# Patient Record
Sex: Female | Born: 1950 | Race: White | Hispanic: No
Health system: Midwestern US, Academic
[De-identification: ages and names within clinical notes are randomized; demographics above are authoritative.]

---

## 2017-01-27 MED ORDER — SINCALIDE 5 MCG IJ SOLR
1.5 ug | Freq: Once | INTRAVENOUS | 0 refills | Status: CP
Start: 2017-01-27 — End: ?

## 2017-01-27 MED ORDER — RP DX TC-99M MEBROFENIN MCI
5 | Freq: Once | INTRAVENOUS | 0 refills | Status: CP
Start: 2017-01-27 — End: ?

## 2017-02-20 MED ORDER — SERTRALINE 50 MG PO TAB
50 mg | ORAL_TABLET | Freq: Every day | ORAL | 3 refills | Status: DC
Start: 2017-02-20 — End: 2017-10-19

## 2017-07-07 ENCOUNTER — Encounter: Admit: 2017-07-07 | Discharge: 2017-07-07 | Payer: BC Managed Care – PPO

## 2017-07-10 ENCOUNTER — Encounter: Admit: 2017-07-10 | Discharge: 2017-07-10 | Payer: BC Managed Care – PPO

## 2017-07-10 DIAGNOSIS — R490 Dysphonia: Principal | ICD-10-CM

## 2017-07-25 ENCOUNTER — Encounter: Admit: 2017-07-25 | Discharge: 2017-07-25 | Payer: BC Managed Care – PPO

## 2017-07-25 ENCOUNTER — Ambulatory Visit: Admit: 2017-07-25 | Discharge: 2017-07-25 | Payer: BC Managed Care – PPO

## 2017-07-25 DIAGNOSIS — F444 Conversion disorder with motor symptom or deficit: ICD-10-CM

## 2017-07-25 DIAGNOSIS — F329 Major depressive disorder, single episode, unspecified: ICD-10-CM

## 2017-07-25 DIAGNOSIS — J383 Other diseases of vocal cords: Principal | ICD-10-CM

## 2017-07-25 DIAGNOSIS — R112 Nausea with vomiting, unspecified: ICD-10-CM

## 2017-07-25 DIAGNOSIS — Z87898 Personal history of other specified conditions: ICD-10-CM

## 2017-07-25 DIAGNOSIS — E785 Hyperlipidemia, unspecified: Principal | ICD-10-CM

## 2017-07-25 DIAGNOSIS — R490 Dysphonia: Principal | ICD-10-CM

## 2017-07-25 DIAGNOSIS — K219 Gastro-esophageal reflux disease without esophagitis: ICD-10-CM

## 2017-07-25 NOTE — Progress Notes
ATTESTATION    I have reviewed the information from this visit and agree with the impression and recommendations.    Staff name:  James David Shannon Kirkendall, MD Date:  07/25/2017

## 2017-07-25 NOTE — Progress Notes
Videostroboscopy completed today.  Please see Dr. Garnett's note or the Procedures section for a link to the full report with still images.

## 2017-08-07 ENCOUNTER — Encounter: Admit: 2017-08-07 | Discharge: 2017-08-07 | Payer: BC Managed Care – PPO

## 2017-08-07 DIAGNOSIS — Z1231 Encounter for screening mammogram for malignant neoplasm of breast: Principal | ICD-10-CM

## 2017-08-16 ENCOUNTER — Ambulatory Visit: Admit: 2017-08-16 | Discharge: 2017-08-16 | Payer: BC Managed Care – PPO

## 2017-08-16 DIAGNOSIS — F444 Conversion disorder with motor symptom or deficit: ICD-10-CM

## 2017-08-16 DIAGNOSIS — J383 Other diseases of vocal cords: ICD-10-CM

## 2017-08-16 DIAGNOSIS — R490 Dysphonia: Principal | ICD-10-CM

## 2017-08-16 DIAGNOSIS — K219 Gastro-esophageal reflux disease without esophagitis: ICD-10-CM

## 2017-08-16 NOTE — Progress Notes
[facility]  Voice Evaluation [phone])  Staff: Sherren Kerns, M.D.  Date: 08/23/2017   G Codes: U0454: CJ - mild + G9172: CI - minimal    S   Rebekah Hill was referred to the [facility] per Dr. Janeece Agee request for a speech pathology voice evaluation and subsequent treatment program.  Patient was seen by Brunetta Jeans, MA CCC-SLP on 07/25/2017.  Videostroboscopy was performed.  Please see full report in patient's chart for further details. Patient presents with a diagnosis of hypofunctional dysphonia with compensatory hyperfunction.     O  CASE HISTORY  Voice complaint: Patient reports that her voice is hoarse.  She states that her voice is raspy and cuts out and she has a difficult time projecting.  She notes that talking is very strained and it makes her very tired to talk.  She has a difficult time reading a book to her grandchildren without losing my voice.  Onset: ~5 months  Voice usage: personal and social.  Voice usage comments: Patient reports that she uses her voice on the phone a lot to family and friends.  She states that phone use is particularly challenging as she has to force it more.  She also communicates with coworkers.  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     Patient Self Rating    Amount of talking: 3.  Quality of voice on day of evaluation: 2.  Severity of problem: 3.    Medical Issues:   Head or Neck surgeries: None reported aside from radio ablation on the neck (C2-C6)  LPR/GERD s/s or meds: None reported  Seasonal or environmental allergies: Patient reports that she may have some allergies but never diagnosed  Smoking history: None reported  Postnasal drainage: Yes, currently, associated with allergies   Pulmonary history: None reported  Dysphagia: Occasionally I haven't had a problem in a while.  She notes severe pain in esophagus and would occasionally make her throw up, she reports that this has not happened in over a year Dietary Issues:   Water: 32-42 ounces/day  Caffeine: None reported  Alcohol: Rarely    Frequent cough: No.  Freq throat clear: Yes  Hearing WNL?: Yes    VOICE EVALUATION    Acoustic Parameters:    A Multi-Dimensional Voice Profile (MDVP) was completed.  Full report generated and placed in patient's chart.  Patient exhibited a mean Fo of 168.226Hz .    Aerodynamics:    Maximum sustained phonation: 10.80 seconds (<6 =abnormal, 15 or >= WNL).  S=14.95.  Z=23.52. S/Z Ratio= 0.64.  (>than 1.4 considered abnormal)    Type of breathing noted: Thoracic.    Perceptual Ratings:    G 1.  R 1.  B 1.  A 0. S 0. TOTAL = 3.    Pitch  Amount of pitch variability: WNL.  Pitch breaks: Yes. x1 moving from chest to head voice    Loudness  Typical level of loudness: Appropriate for situation.  Amount of loudness variability: Range of emphasis WNL.  Loudness range from soft to maximum level: YES (normal range).    Articulation: WNL.  Rate: WNL.  Resonance: WNL.  Tension/Effort noted in the jaw, neck, face, and perilaryngeal areas: Mild.  Pain upon palpation of the perilaryngeal area: Yes.    Voice was characterized as: husky, raspy, slightly strained.    A  IMPRESSIONS  Patient presents with: mild dysphonia.    The following therapeutic strategies were stimulable: Vocal fold  adductor exercises.  -May require FFF/resonant voice work as well as circumlaryngeal massage    Prognosis for modifying etiologic factors and improving vocal techniques is: excellent.    P  RECOMMENDATIONS  Voice Therapy - Approximately 10 sessions over the next 6 to 8 weeks working on the following goals:    Long-term goal:  Patient will exhibit optimal function for professional, personal, and social needs.    Short-term goals:     1. Patient will increase understanding of normal phonation and abnormal phonation.    2. Patient will increase understanding of and compliance with vocal hygiene guidelines. Patient will identify good vocal hygiene given functional situations with greater than 90% accuracy.    3. Patient will demonstrate diaphragmatic breathing during structured and conversational speech tasks with 90% accuracy.    4. Patient will exhibit 90% accuracy with adductor vocal fold exercises.    5. Patient will increase loudness during structured and conversational speech tasks 90% of the time.      The findings and recommendations were discussed with the patient and referring physician with good understanding and agreement.    CH: 0% Impaired  CI: 1% to 19% Impaired  CJ: 20% to 39% Impaired  CK: 40% to 59% Impaired  CL: 60% to 79% Impaired  CM: 80% to 99% Impaired  CN: 100% Impaired

## 2017-08-17 ENCOUNTER — Encounter: Admit: 2017-08-17 | Discharge: 2017-08-17 | Payer: BC Managed Care – PPO

## 2017-08-17 DIAGNOSIS — R490 Dysphonia: Principal | ICD-10-CM

## 2017-08-18 ENCOUNTER — Ambulatory Visit: Admit: 2017-08-18 | Discharge: 2017-08-18 | Payer: BC Managed Care – PPO

## 2017-08-18 DIAGNOSIS — Z1231 Encounter for screening mammogram for malignant neoplasm of breast: Principal | ICD-10-CM

## 2017-08-18 NOTE — Progress Notes
Noted, no change in tx.

## 2017-09-07 ENCOUNTER — Ambulatory Visit: Admit: 2017-09-07 | Discharge: 2017-09-07 | Payer: BC Managed Care – PPO

## 2017-09-07 DIAGNOSIS — J383 Other diseases of vocal cords: ICD-10-CM

## 2017-09-07 DIAGNOSIS — F444 Conversion disorder with motor symptom or deficit: ICD-10-CM

## 2017-09-07 DIAGNOSIS — R490 Dysphonia: Principal | ICD-10-CM

## 2017-09-07 DIAGNOSIS — K219 Gastro-esophageal reflux disease without esophagitis: ICD-10-CM

## 2017-09-07 NOTE — Progress Notes
[facility]  Voice Therapy [phone])  Staff: Alona Bene, M.D.  Date: 09/07/2017      S   Rebekah Hill was referred to the [facility] per Dr. Janeece Agee request for a speech pathology voice evaluation and subsequent treatment program.  Patient was seen by Brunetta Jeans, MA CCC-SLP on 07/25/2017.  Videostroboscopy was performed.  Please see full report in patient's chart for further details. Patient presents with a diagnosis of hypofunctional dysphonia with compensatory hyperfunction.  Rebekah Hill returns to clinic today to work on the goals established during the voice evaluation session.    She reports that her voice was doing well prior to getting sick. She reports that her voice was stronger during exercises, but noted that she was seeing limited improvement during conversational speech.     O  Long-term goal:  Patient will exhibit optimal function for professional, personal, and social needs.    Short-term goals:     1.  Patient will increase understanding of normal phonation and abnormal phonation.  INITIATED Patient identified abnormal phonation with 80% accuracy and mild cueing from clinician.     2.  Patient will increase understanding of and compliance with vocal hygiene guidelines. Patient will identify good vocal hygiene given functional situations with greater than 90% accuracy.  INITIATED    3.  Patient will demonstrate diaphragmatic breathing during structured and conversational speech tasks with 90% accuracy.  INITIATED.  Patient utilized diaphragmatic breathing with 90% accuracy, mild cueing from clinician.     4.  Patient will complete vocal fold adduction exercises with 90% accuracy independently.  INITIATED.  Completed with 80% accuracy, mild to moderate cueing from clinician to increase loudness and keep her tongue toward the front of the mouth/not retract to her throat to improve clarity and eliminate tension as well as cueing required to increase loudness. A  Rebekah Hill demonstrates mild dysphonia characterized by reduced volume with husky quality.  Improved technique today for reduced strain and better quality.  Will continue to build strength and may need to initiate resonant voice pending progress by next visit.  She still struggles with reading to her grandchildren voice-wise, and will bring in a book for evaluation during next session.    P  Recommendations include for patient to complete adduction exercises as directed.  She plans to return to clinic in 3 weeks to continue to work on the above goals.  Rebekah Hill was encouraged to contact me with any further questions or concerns and left the clinic in excellent condition.

## 2017-09-07 NOTE — Progress Notes
ATTESTATION    I have reviewed the information from this visit and agree with the impression and recommendations.    Staff name:  Lynise Porr, MD Date:  09/07/2017

## 2017-09-19 ENCOUNTER — Encounter: Admit: 2017-09-19 | Discharge: 2017-09-19 | Payer: BC Managed Care – PPO

## 2017-09-19 ENCOUNTER — Ambulatory Visit: Admit: 2017-09-19 | Discharge: 2017-09-20 | Payer: BC Managed Care – PPO

## 2017-09-19 DIAGNOSIS — F329 Major depressive disorder, single episode, unspecified: ICD-10-CM

## 2017-09-19 DIAGNOSIS — J0110 Acute frontal sinusitis, unspecified: Principal | ICD-10-CM

## 2017-09-19 DIAGNOSIS — Z87898 Personal history of other specified conditions: ICD-10-CM

## 2017-09-19 DIAGNOSIS — R112 Nausea with vomiting, unspecified: ICD-10-CM

## 2017-09-19 DIAGNOSIS — E785 Hyperlipidemia, unspecified: Principal | ICD-10-CM

## 2017-09-19 MED ORDER — AMOXICILLIN-POT CLAVULANATE 875-125 MG PO TAB
1 | ORAL_TABLET | Freq: Two times a day (BID) | ORAL | 0 refills | 7.00000 days | Status: AC
Start: 2017-09-19 — End: 2019-08-19

## 2017-09-19 NOTE — Progress Notes
Date of Service: 09/19/2017    Rebekah Hill is a 66 y.o. female.  DOB: [birth year]  MRN: [id number]     Subjective:             History of Present Illness  Chief Complaint   Patient presents with   ??? URI     x 1 week, cough, swollen glands, pressure in forehead, stuffy nose     Rebekah Hill is here today for evaluation of upper respiratory symptoms.  For a week she has been dealing with a cough, tender glands in her neck, sinus pressure and a stuffy nose. She has been taking Advil 400 mg every 4 hrs while away. Taken Mucinex only in the am. She thinks she's getting worse. She has not taken antibiotics recently. NKDA.          Constitutional: energy level low.  Weight stable. No fever.  No chills.  + Fatigue.  + Headache.  Eyes:  No vision problems, no drainage.  Ears/Nose/Mouth/Throat:  No hearing difficulties.   + drainage and congestion.  No oral ulcers.  No sore throat.  No difficulty swallowing.  CV: No chest pain or palpitations.  Resp: No shortness of breath.  + Cough.  GI: No abdominal pain.  Bowels normal, with no hematochezia or melena.  GU: no dysuria.  No urinary frequency.  No significant incontinence.  MS:  No significant joint or muscle pain.  SKIN:  Has noted no new moles or changes in moles.  NEURO:  No tingling, numbness, weakness, or other complaints.  PSYCH: Mood good, denies depression or anxiety.  Sleeps well.              Objective:         ??? amoxicillin/K clavulanate (AUGMENTIN) 875/125 mg tablet Take one tablet by mouth every 12 hours. Take with food.   ??? Biotin 1 mg tab Take  by mouth daily.   ??? DOCOSAHEXANOIC ACID/EPA (FISH OIL PO) Take  by mouth daily.   ??? ERGOCALCIFEROL (VITAMIN D2) (VITAMIN D PO) Take 2,000 Units by mouth daily.   ??? FEXOFENADINE HCL (ALLEGRA PO) Take  by mouth daily as needed.   ??? GLUCOSAMINE HCL/CHONDR SU A NA (OSTEO BI-FLEX PO) Take 1 mg by mouth daily.   ??? MULTIVITAMIN W-MINERALS/LUTEIN (CENTRUM SILVER PO) Take  by mouth daily. ??? sertraline (ZOLOFT) 50 mg tablet Take 1 tablet by mouth daily.     Vitals:    09/19/17 1602 09/19/17 1615   BP: 142/78 136/78   Pulse: 70    Temp: 36.3 ???C (97.4 ???F)    TempSrc: Oral    Weight: 57.1 kg (125 lb 12.8 oz)    Height: 154.9 cm (61)      Body mass index is 23.77 kg/m???.   Alert, no distress.  HEENT:  External ear canals are negative.  TM's are full bilaterally with serous effusions, no erythema.  Oropharynx is red and streaky, with no exudates or ulcers seen.  Dentition appears normal.  Frontal sinuses are tender to palpation bilat.  PERRLA.  EOMI.  Conjunctiva are non-icteric and are not injected.  Neck -mild SM and AC LAD.  LUNGS:  Clear to auscultation bilat, with no rales, rhonchi, or wheezing.           Assessment and Plan:  Rebekah Hill was seen today for uri.    Diagnoses and all orders for this visit:    Acute non-recurrent frontal sinusitis    Other orders  -  amoxicillin/K clavulanate (AUGMENTIN) 875/125 mg tablet; Take one tablet by mouth every 12 hours. Take with food.  -     FLU VACCINE =>6 MONTHS QUADRIVALENT PF      Frontal sinusitis-Augmentin as prescribed.  She requested a printed prescription to take to pharmacy on the way home.  She should continue her Flonase twice a day until symptoms improve.  She can resume her Allegra for sinus drainage.  She may continue nasal irrigation as directed by her ENT.  Follow-up if symptoms are not improving after 72 hours.  She did not have a fever today and wanted her flu shot so this was administered.    Follow-up with PCP as previously scheduled or sooner if needed.

## 2017-09-19 NOTE — Telephone Encounter
Pt called she started having a sore throat last week with loose cough, left ear ache, some stuffy nose in am, glands in neck are sore and tender. Pt has been taking mucinex in am and advil every 4 hours for dull headache. [facility]

## 2017-09-20 ENCOUNTER — Encounter: Admit: 2017-09-20 | Discharge: 2017-09-20 | Payer: BC Managed Care – PPO

## 2017-09-22 ENCOUNTER — Encounter: Admit: 2017-09-22 | Discharge: 2017-09-22 | Payer: BC Managed Care – PPO

## 2017-10-12 ENCOUNTER — Ambulatory Visit: Admit: 2017-10-12 | Discharge: 2017-10-12 | Payer: BC Managed Care – PPO

## 2017-10-12 DIAGNOSIS — R490 Dysphonia: Principal | ICD-10-CM

## 2017-10-12 DIAGNOSIS — F444 Conversion disorder with motor symptom or deficit: ICD-10-CM

## 2017-10-12 DIAGNOSIS — K219 Gastro-esophageal reflux disease without esophagitis: ICD-10-CM

## 2017-10-12 DIAGNOSIS — J383 Other diseases of vocal cords: ICD-10-CM

## 2017-10-12 NOTE — Progress Notes
[facility]  Voice Therapy [phone])  Staff: Alona Bene, M.D.  Date: 10/12/2017    ???  S   Rebekah Hill???was referred to the [facility] per Dr. Janeece Agee request for a speech pathology voice evaluation and subsequent treatment program. ???Patient was seen by Brunetta Jeans, MA CCC-SLP???on 07/25/2017. ???Videostroboscopy was???performed. ???Please see full report in patient's chart for further details. Patient presents with a diagnosis of hypofunctional dysphonia with compensatory hyperfunction.  Rebekah Hill returns to clinic today to work on the goals established during the voice evaluation session.  ???  Patient states that her voice is not doing as well as it was previously.  She notes that it was doing better, but then she became sick again and is noting continued problems.  However, she does report that her voice is not as bad as it was when she first came in.   ???  O  Long-term goal:  Patient will exhibit optimal function for professional, personal, and social needs.  ???  Short-term goals:     1.  Patient will increase understanding of normal phonation and abnormal phonation.  GOAL IN PROGRESS. Patient identified abnormal phonation with 80% accuracy and mild cueing from clinician.   ???  2.  Patient will increase understanding of and compliance with vocal hygiene guidelines. Patient will identify good vocal hygiene given functional situations with greater than 90% accuracy.  GOAL IN PROGRESS.  ???  3.  Patient will demonstrate diaphragmatic breathing during structured and conversational speech tasks with 90% accuracy.  GOAL PARTIALLY MET.  Patient utilized diaphragmatic breathing with 90% accuracy, minimal cueing from clinician.   ???  4.  Patient will complete vocal fold adduction exercises with 90% accuracy independently.  GOAL IN PROGRESS.  Completed with 90% accuracy, mild to moderate cueing from clinician to increase loudness and keep her tongue toward the front of the mouth/not retract to her throat to improve clarity and eliminate tension as well as cueing required to increase loudness.  Worked toward reducing tension.    5. Patient will participate in exercises specifically focused on reducing the degree of musculoskeletal tension with greater than 90% accuracy.  INITIATED.  Patient completed cup bubbling, both voiceless and voiced with 80% accuracy, moderate verbal cueing and initial direct models from clinician.   She completed resonant hum with 70% accuracy, moderate cueing and direct models.      6. Patient will demonstrate a front and forward focus while voicing with 90% accuracy during structured and conversational speech tasks.  INITIATED.  Patient completed FFF at the single word with 60% accuracy, moderate to max verbal cueing and frequent direct modeling.     ???  ???  A  Rebekah Hill demonstrates mild dysphonia characterized by reduced volume with husky quality and some increased strain noted over previous session.  Improved technique today for reduced strain and better quality particularly with implementation of FFF work.  Will continue both FFF as well as adduction exercises to address weak system and hyperfunctional behaviors.     P  Recommendations include for patient to complete adduction exercises as directed with FFF exercises immediately prior to do so.  She plans to return to clinic in 3 weeks to continue to work on the above goals.  Rebekah Hill was encouraged to contact me with any further questions or concerns and left the clinic in excellent condition.

## 2017-10-12 NOTE — Progress Notes
ATTESTATION    I have reviewed the information from this visit and agree with the impression and recommendations.    Staff name:  Sherryll Skoczylas, MD Date:  10/12/2017

## 2017-10-18 ENCOUNTER — Encounter: Admit: 2017-10-18 | Discharge: 2017-10-18 | Payer: BC Managed Care – PPO

## 2017-10-19 MED ORDER — SERTRALINE 50 MG PO TAB
ORAL_TABLET | Freq: Every day | 0 refills | Status: AC
Start: 2017-10-19 — End: 2018-01-17

## 2017-11-08 ENCOUNTER — Ambulatory Visit: Admit: 2017-11-08 | Discharge: 2017-11-08

## 2017-11-08 DIAGNOSIS — K219 Gastro-esophageal reflux disease without esophagitis: ICD-10-CM

## 2017-11-08 DIAGNOSIS — J383 Other diseases of vocal cords: ICD-10-CM

## 2017-11-08 DIAGNOSIS — R490 Dysphonia: Principal | ICD-10-CM

## 2017-11-08 DIAGNOSIS — F444 Conversion disorder with motor symptom or deficit: ICD-10-CM

## 2017-11-22 ENCOUNTER — Ambulatory Visit: Admit: 2017-11-22 | Discharge: 2017-11-22 | Payer: BC Managed Care – PPO

## 2017-11-22 DIAGNOSIS — R490 Dysphonia: Principal | ICD-10-CM

## 2017-11-22 DIAGNOSIS — K219 Gastro-esophageal reflux disease without esophagitis: ICD-10-CM

## 2017-11-22 DIAGNOSIS — J383 Other diseases of vocal cords: ICD-10-CM

## 2017-11-22 DIAGNOSIS — F444 Conversion disorder with motor symptom or deficit: ICD-10-CM

## 2018-01-17 ENCOUNTER — Encounter: Admit: 2018-01-17 | Discharge: 2018-01-17 | Payer: BC Managed Care – PPO

## 2018-01-17 MED ORDER — SERTRALINE 50 MG PO TAB
ORAL_TABLET | Freq: Every day | 0 refills | Status: AC
Start: 2018-01-17 — End: 2019-08-19

## 2018-01-23 ENCOUNTER — Ambulatory Visit: Admit: 2018-01-23 | Discharge: 2018-01-23 | Payer: BC Managed Care – PPO

## 2018-01-23 ENCOUNTER — Encounter: Admit: 2018-01-23 | Discharge: 2018-01-23 | Payer: BC Managed Care – PPO

## 2018-01-23 DIAGNOSIS — R490 Dysphonia: Principal | ICD-10-CM

## 2018-01-23 DIAGNOSIS — F329 Major depressive disorder, single episode, unspecified: ICD-10-CM

## 2018-01-23 DIAGNOSIS — E785 Hyperlipidemia, unspecified: Principal | ICD-10-CM

## 2018-01-23 DIAGNOSIS — R112 Nausea with vomiting, unspecified: ICD-10-CM

## 2018-01-23 DIAGNOSIS — F444 Conversion disorder with motor symptom or deficit: ICD-10-CM

## 2018-01-23 DIAGNOSIS — J383 Other diseases of vocal cords: ICD-10-CM

## 2018-01-23 DIAGNOSIS — Z87898 Personal history of other specified conditions: ICD-10-CM

## 2018-01-23 DIAGNOSIS — K219 Gastro-esophageal reflux disease without esophagitis: Principal | ICD-10-CM

## 2018-01-25 ENCOUNTER — Encounter: Admit: 2018-01-25 | Discharge: 2018-01-25 | Payer: BC Managed Care – PPO

## 2018-01-25 DIAGNOSIS — Z87898 Personal history of other specified conditions: ICD-10-CM

## 2018-01-25 DIAGNOSIS — E785 Hyperlipidemia, unspecified: Principal | ICD-10-CM

## 2018-01-25 DIAGNOSIS — F329 Major depressive disorder, single episode, unspecified: ICD-10-CM

## 2018-01-25 DIAGNOSIS — R112 Nausea with vomiting, unspecified: ICD-10-CM

## 2018-04-18 ENCOUNTER — Encounter: Admit: 2018-04-18 | Discharge: 2018-04-18 | Payer: BC Managed Care – PPO

## 2018-04-18 MED ORDER — SERTRALINE 50 MG PO TAB
ORAL_TABLET | Freq: Every day | 0 refills
Start: 2018-04-18 — End: ?

## 2018-08-28 ENCOUNTER — Ambulatory Visit: Admit: 2018-08-28 | Discharge: 2018-08-28 | Payer: BC Managed Care – PPO

## 2018-08-28 DIAGNOSIS — Z1231 Encounter for screening mammogram for malignant neoplasm of breast: Principal | ICD-10-CM

## 2019-01-05 IMAGING — CR HIP LT 2 VW
1 series · 2 of 2 positions shown · non-contrast
Comparison: None.

HISTORY: Left hip pain.
TECHNIQUE: HIP LT 2 VW.

[Series 1: frog lat · 0.17mm/px · 2 of 2 slices shown]
[im 1/2]
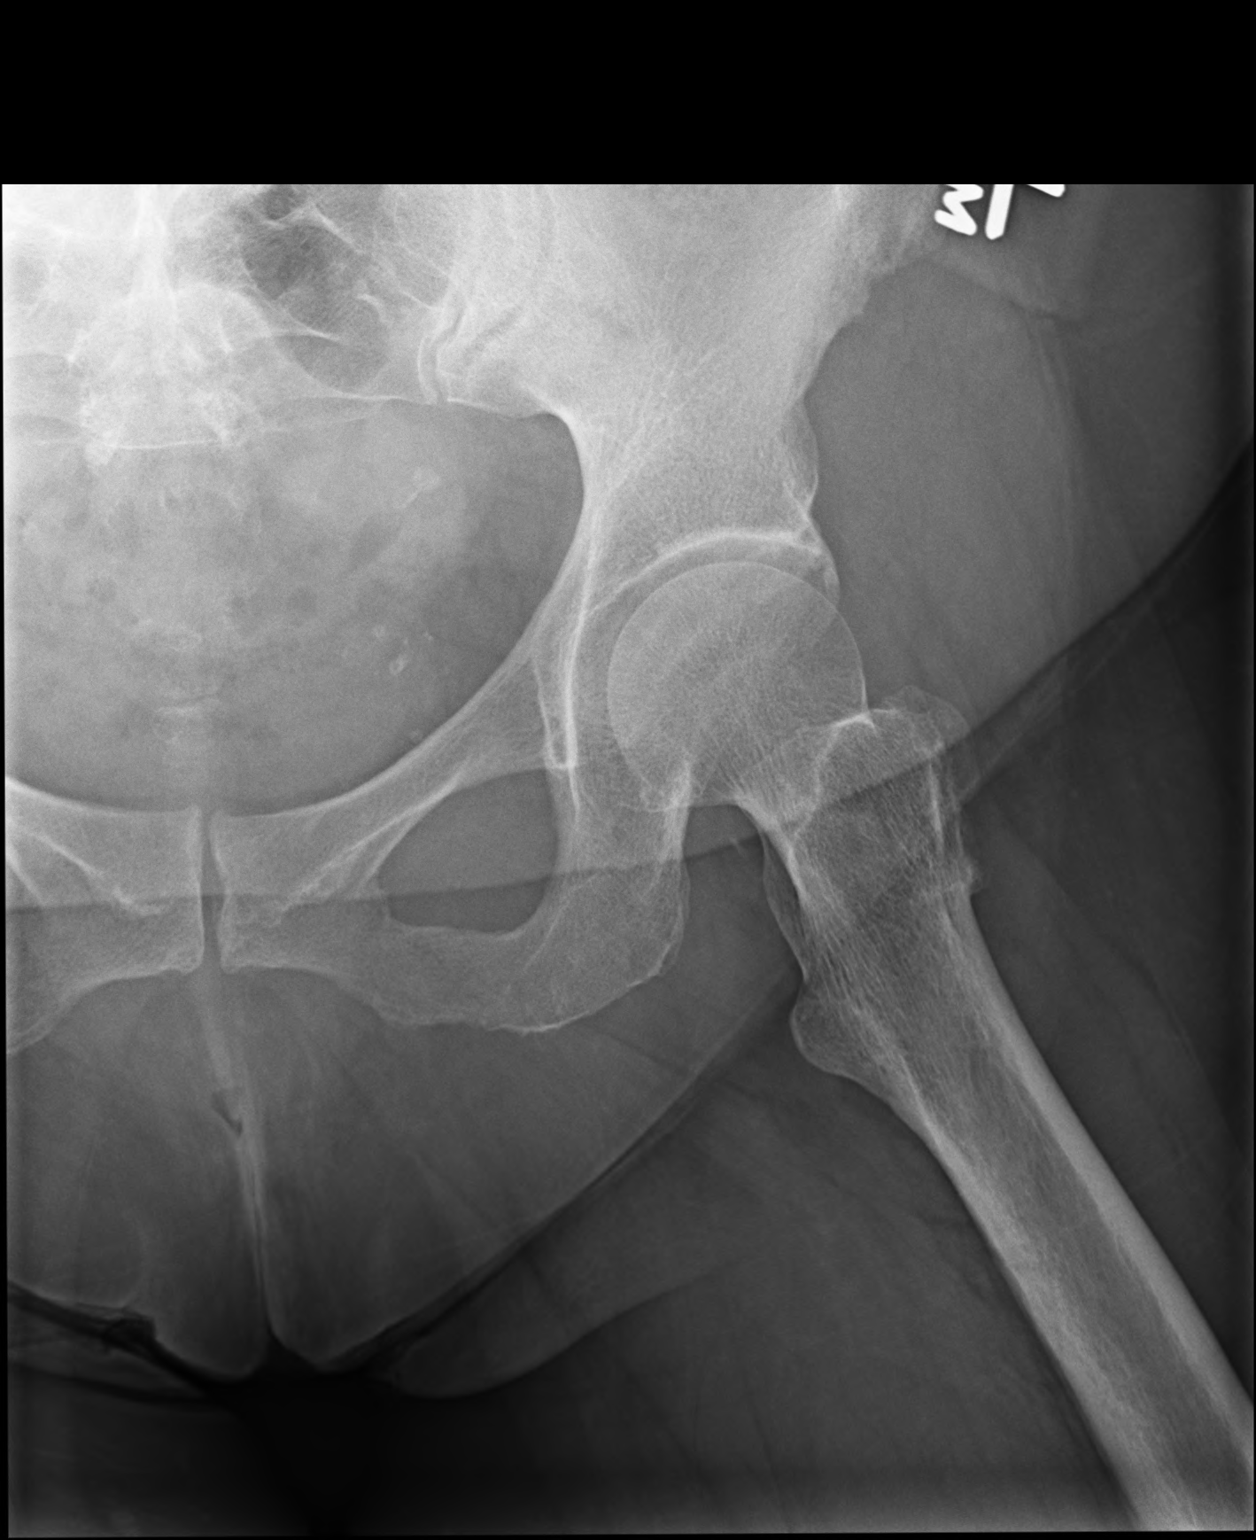
[im 2/2]
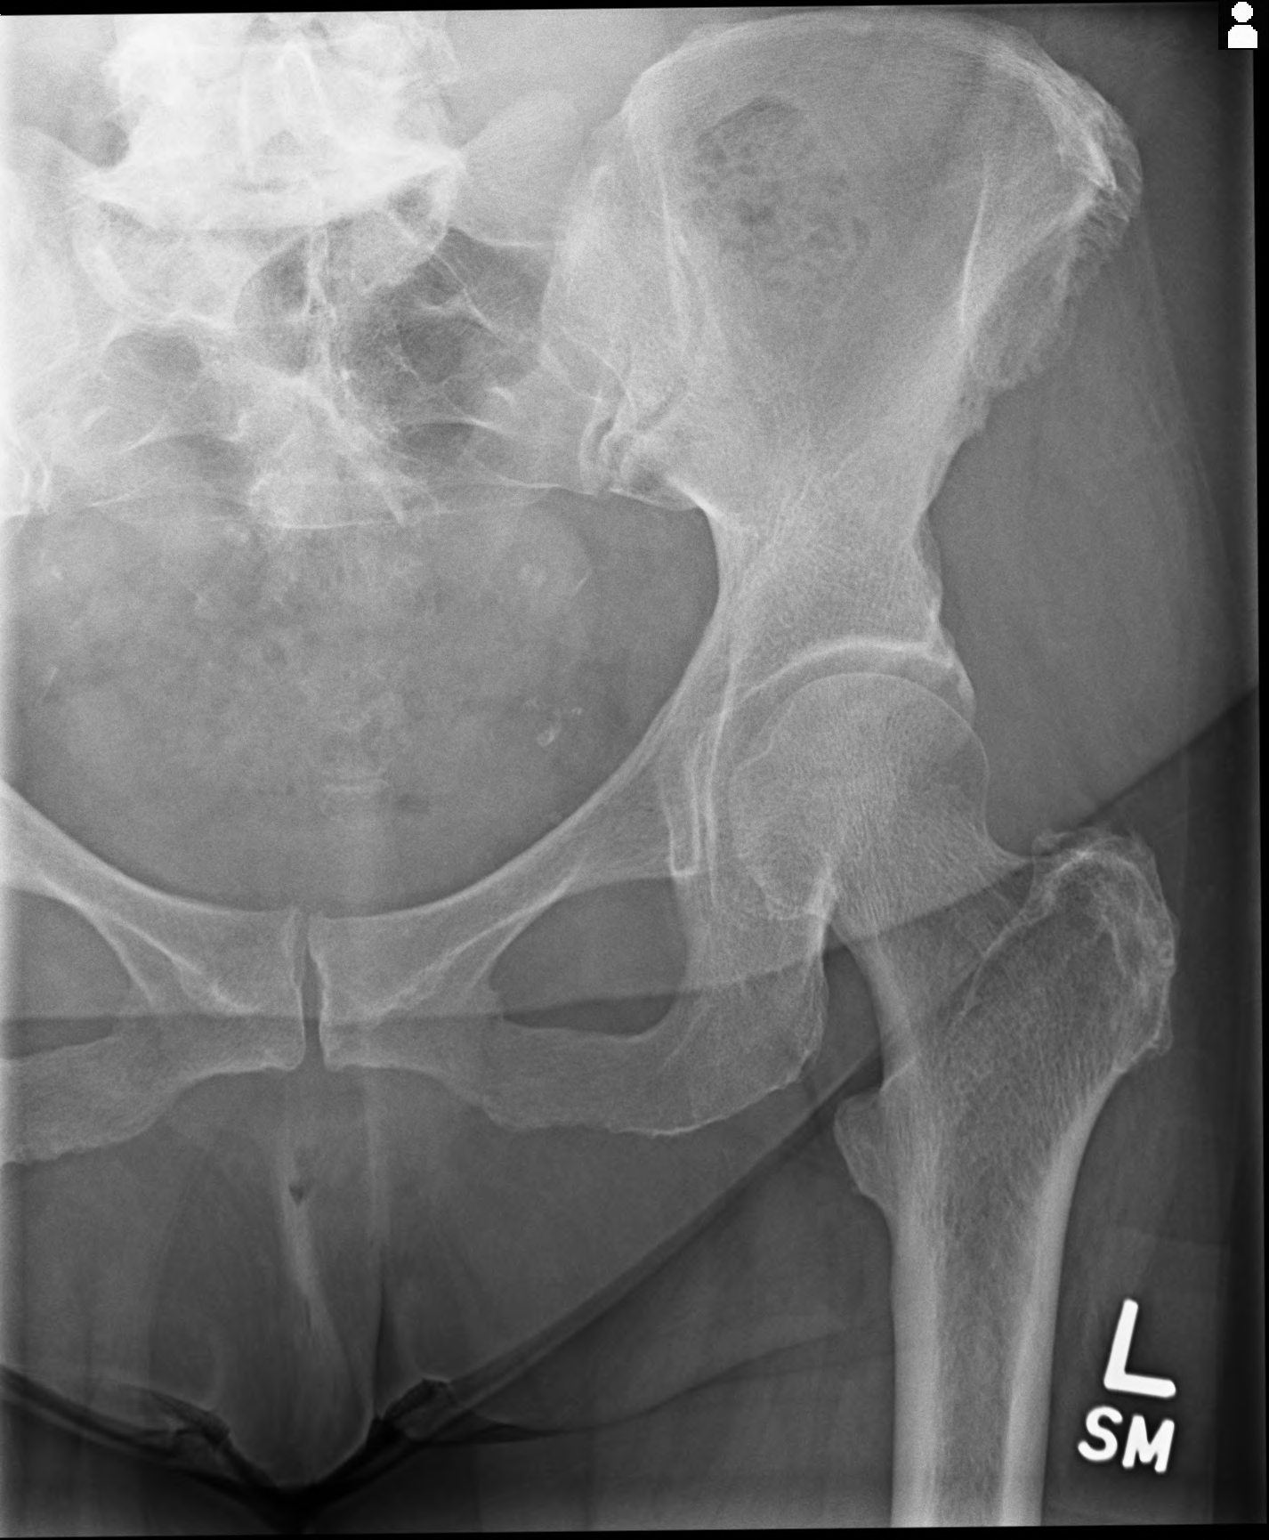

[2 of 2 positions shown; findings below may reference images not displayed]

FINDINGS: 2 views of the left hip show mild degenerative changes at the hip joint, some hyperostosis at the iliac wing and greater trochanter and no evidence of fracture or dislocation.
IMPRESSION: Hyperostosis. Mild degenerative change. No other abnormality.

## 2019-08-19 ENCOUNTER — Encounter: Admit: 2019-08-19 | Discharge: 2019-08-19 | Payer: MEDICARE

## 2019-08-19 ENCOUNTER — Ambulatory Visit: Admit: 2019-08-19 | Discharge: 2019-08-19 | Payer: MEDICARE

## 2019-08-19 DIAGNOSIS — Z8041 Family history of malignant neoplasm of ovary: Secondary | ICD-10-CM

## 2019-08-19 DIAGNOSIS — M8589 Other specified disorders of bone density and structure, multiple sites: Secondary | ICD-10-CM

## 2019-08-19 DIAGNOSIS — K219 Gastro-esophageal reflux disease without esophagitis: Secondary | ICD-10-CM

## 2019-08-19 DIAGNOSIS — F329 Major depressive disorder, single episode, unspecified: Secondary | ICD-10-CM

## 2019-08-19 DIAGNOSIS — F320 Major depressive disorder, single episode, mild: Secondary | ICD-10-CM

## 2019-08-19 DIAGNOSIS — Z136 Encounter for screening for cardiovascular disorders: Secondary | ICD-10-CM

## 2019-08-19 DIAGNOSIS — E2839 Other primary ovarian failure: Secondary | ICD-10-CM

## 2019-08-19 DIAGNOSIS — Z0000 Encounter for general adult medical examination without abnormal findings: Secondary | ICD-10-CM

## 2019-08-19 DIAGNOSIS — R5383 Other fatigue: Secondary | ICD-10-CM

## 2019-08-19 DIAGNOSIS — Z87898 Personal history of other specified conditions: Secondary | ICD-10-CM

## 2019-08-19 DIAGNOSIS — E785 Hyperlipidemia, unspecified: Secondary | ICD-10-CM

## 2019-08-19 DIAGNOSIS — R112 Nausea with vomiting, unspecified: Secondary | ICD-10-CM

## 2019-08-19 DIAGNOSIS — Z803 Family history of malignant neoplasm of breast: Secondary | ICD-10-CM

## 2019-08-19 DIAGNOSIS — R490 Dysphonia: Secondary | ICD-10-CM

## 2019-08-19 MED ORDER — SERTRALINE 50 MG PO TAB
50 mg | ORAL_TABLET | Freq: Every day | ORAL | 3 refills | Status: DC
Start: 2019-08-19 — End: 2019-10-04

## 2019-08-19 MED ORDER — ATORVASTATIN 20 MG PO TAB
20 mg | ORAL_TABLET | Freq: Every day | ORAL | 3 refills | Status: AC
Start: 2019-08-19 — End: ?

## 2019-08-19 NOTE — Progress Notes
Health Risk Assessment Questionnaire  Current Care  List of Providers you have seen in the last two years: (not recorded)  Are you receiving home health?: No  During the past 4 weeks, how would you rate your health in general?: Very Good    Outside Care  Since your last PCP visit, have you received care outside of [facility]?: (!) Yes    Physical Activity  Do you exercise or are you physically active?: Yes    Diet  In the past month, were you worried whether your food would run out before you or your family had money to buy more?: No  In the past 7 days, how many times did you eat fast food or junk food or pizza?: (!) 3 or more times  In the past 7 days, how many servings of fruits or vegetables did you eat each day?: (!) 2-3  In the past 7 days, how many sodas and sugar sweetened drinks (regular, not diet) did you drink each day?: 0    Smoke/Tobacco Use  Are you currently a smoker?: No    Alcohol Use  Do you drink alcohol?: Yes    Depression Screen  Little interest or pleasure in doing things: Not at All  Feeling down, depressed or hopeless: Not at All    Pain  How would you rate your pain today?: No pain    Ambulation  Do you use any assistive devices for ambulation?: No    Fall Risk  Does it take you longer than 30 seconds to get up and out of a chair?: No    Motor Vehicle Safety  Do you fasten your seat belt when you are in the car?: Yes    Sun Exposure  Do you protect yourself from the sun? For example, wear sunscreen when outside.: Yes    Hearing Loss  Do you have trouble hearing the television or radio when others do not?: No  Do you have to strain or struggle to hear/understand conversation?: No  Do you use hearing aids?: No    Cognitive Impairment  During the past 12 months, have you experienced confusion or memory loss that is happening more often or is getting worse?: No    Functional Screen  Do you live alone?: Yes  Do you live at: Home Can you drive your own car or travel alone by bus or taxi?: Yes  Can you shop for groceries or clothes without help?: Yes  Can you prepare your own meals?: Yes  Can you do your own housework without help?: Yes  Can you handle your own money without help?: Yes  Do you need help eating, bathing, dressing, or getting around your home?: No  Do you feel safe?: Yes  Does anyone at home hurt you, hit you, or threaten you?: No  Have you ever been the victim of abuse?: No    Home Safety  Does your home have grab bars in the bathroom?: (!) No  Does your home have hand rails on stairs and steps?: Yes  Does your home have functioning smoke alarms?: Yes    Advance Directive  Do you have a living will or Advance Directive?: Yes    Dental Screen  Have you had an exam by your dentist in the last year?: (!) No    Vision Screen  Do you have diabetes?: No(a year ago)    She presents today for an Annual Medicare Wellness visit.  Past, family,  social hx reviewed with pt and documented.   A Health Risk assessment was performed by the patient today, reviewed with the patient and the results are above.    Other health care providers: see above  Special diet:   No Added Salt yes    Low fat yes    Carbohydrate counting no  She does not have cognitive dysfunction.   PHQ-2 depression screening - 0  She does not report hearing loss.   is generally independent in ADLs   Home safety screen assessed: (smoke alarms, railings on stairs, grab bar in bathroom) Yes  She issteady on her feet and able to perform the get up and go test promptly.   We did discuss end of life issues in the past.She does have a living will and will provide a copy of the document to have scanned into the medical record.  A lipid panel has been done in the past 5 years.  For colon cancer screening the patient has had a colonoscopy within the past 10 years.   Fasting blood sugar ordered.   Glaucoma screening recommended for at risk individuals.     For female patients only: Not at risk high risk cervical cancer.  No history of cervical dysplasia within the past 20  years and last 3 paps reportedly normal/ testing deferred.    Mammogram iscurrent and recommended annually.   She has been screened for osteoporosis with a bone density   She denies trouble with urinary incontinence.    Records reflect the pneumococcal vaccine and tdap are current.   Personal prevention reviewed with patient and a copy was given via the After Visit Summary.    While the patient was here today, due to his/her multiple chronic conditions it would be in the best interest of the patient for me to monitor, assess and evaluate those as well.     Last seen by me in 12/2016 for f/u ER visit RUQ pain and for physical.  Saw Monica in 08/2017 with sinusitis.    Had to switch to [place] [facility] due to insurance but now is back.    Saw [facility] in 2018 for hoarseness.  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  Did vocal exercises for a year - no help, so she stopped them.    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  Since retirement, hiked in [place], now going to [employer].  Has 2 grandkids ages 75 and 2 here in [facility].  Other grandkids in [place], daughter has lived there 20 yrs with her [place] husband.  Due to Covid they probably won't get together over the holidays - the first time in years, which makes her understandably sad.    Has had some pressure in chest/epigastrium.  Belching relieves it  No meds for heartburn tried.  Triggered by spicy food, sweets.    Had shingles 1.5 yrs ago, and then got the Shingrx vaccine at [facility], [address].    [facility] put her on Atorvastatin 20mg  for her high chol levels.    BP high today, one year ago checked it daily for a long gime, was fine    Also had an abnl kidney test - checked it agin and stable so no further testing was recommended.    Has mild depression, controlled with Sertraline. FH breast ca; she is up to date on Mammogram.     H/o abnl Pap  in past but had hysterectomy last year.     H/o hyperlipidemia, diet tx'd.     H/o osteopenia; last Dexa 12/2015.  Had left wrist fracture 2017  Taking Caltrate     Health maint items reviewed and discussed:  -Tetanus  - had n 2017, need details  Shingrix - had at [facility] [address]  Pnruemovax-23  Flu shot  -Dexa  -Mammo due after Nov 5th    Past medical history, medications, medication allergies, social and family history are all reviewed.    Patient Active Problem List    Diagnosis Date Noted   ? Dysphonia 07/25/2017   ? 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 Osteopenia 12/30/2015   ? Family history of breast cancer in sister 07/21/2015   ? Family history of ovarian cancer 07/21/2015   ? Hyperlipidemia, unspecified    ? Mild depression (HCC)    ? History of abnormal cervical Pap smear      MED LIST:  Outpatient Encounter Medications as of 08/19/2019   Medication Sig Dispense Refill   ? [DISCONTINUED] amoxicillin/K clavulanate (AUGMENTIN) 875/125 mg tablet Take one tablet by mouth every 12 hours. Take with food. 20 tablet 0   ? atorvastatin (LIPITOR) 20 mg tablet Take one tablet by mouth daily. 90 tablet 3   ? Biotin 1 mg tab Take  by mouth daily.     ? DOCOSAHEXANOIC ACID/EPA (FISH OIL PO) Take  by mouth daily.     ? ERGOCALCIFEROL (VITAMIN D2) (VITAMIN D PO) Take 2,000 Units by mouth daily.     ? FEXOFENADINE HCL (ALLEGRA PO) Take  by mouth daily as needed.     ? GLUCOSAMINE HCL/CHONDR SU A NA (OSTEO BI-FLEX PO) Take 1 mg by mouth daily.     ? MULTIVITAMIN W-MINERALS/LUTEIN (CENTRUM SILVER PO) Take  by mouth daily.     ? sertraline (ZOLOFT) 50 mg tablet Take one tablet by mouth daily. 90 tablet 3   ? [DISCONTINUED] sertraline (ZOLOFT) 50 mg tablet TAKE 1 TABLET DAILY 90 tablet 0     No facility-administered encounter medications on file as of 08/19/2019.      ROS: Constitutional: energy level low.  Weight stable. No fever.  No chills.  Eyes:  No vision problems, no drainage.  Ears/Nose/Mouth/Throat:  No hearing difficulties.  No congestion.  No oral ulcers.  No sore throat.  No difficulty swallowing.  CV: No chest pain or palpitations.  Resp: No shortness of breath or cough.  GI: No abdominal pain.  Bowels normal, with no hematochezia or melena.  GU: no dysuria.  No urinary frequency.  No significant incontinence.  MS:  No significant joint or muscle pain.  SKIN:  Has noted no new moles or changes in moles.  BREASTS:  Completes breast self-exam regularly and has not noticed any changes.  NEURO:  No tingling, numbness, weakness, or other complaints.  PSYCH: Mood good, denies depression or anxiety.  Sleeps well.  ENDO: no vaginal bleeding.  No vaginal discharge.    Objective  Vitals:    08/19/19 1357   BP: (!) 148/74   BP Source: Arm, Right Upper   Patient Position: Sitting   Pulse: 62   SpO2: 98%   Weight: 58 kg (127 lb 12.8 oz)   Height: 154.9 cm (61)   PainSc: Zero     Body mass index is 24.15 kg/m?Marland Kitchen    Constitutional: Alert, well nourished, in no  distress.    Eyes:  PERRLA, EOMI.  Conjunctiva are non-icteric and are no injected.  ENT: External ear canals are negative.  TM's are clear bilat, with no erythema.     ENDO:  No thyroid enlargement or nodules.  BREASTS:  Mild to moderate fibrocystic changes bilat, with no dominant mass or nodule.  No skin abnormality.  No axillary adenopathy.  No nipple discharge.  CV: Heart exam shows regular rhythm, no murmur, no S3, no S4.  Carotid pulses are 2/4 and equal bilat, with no carotid bruits.  Good DP Pulses bilat.  RESP: Lungs are clear to auscultation bilat, with no rales, rhonchi, or wheezing.  GI: Normal bowel sounds.  Abdomen is soft, nontender.  No hepatosplenomegaly, no mass.   MS: no joint swelling or erythema.  SKIN:  No rash, no abnormal appearing moles.  NEURO:  CN's 2-12 intact; strength is equal bilat. PSYCH:  Good eye contact, normal affect.  LYMPH:  No significant LAD in neck, axillary or groin areas.    ASSESSMENT/PLAN:  Rebekah Hill was seen today for annual wellness visit and cholesterol.    Diagnoses and all orders for this visit:    Welcome to Medicare preventive visit    Hyperlipidemia, unspecified hyperlipidemia type  -     CBC AND DIFF; Future  -     COMPREHENSIVE METABOLIC PANEL; Future  -     LIPID PROFILE; Future  -     CT CARDIAC CALCIUM SCORE WO CONT; Future    Dysphonia    Family history of breast cancer in sister    Family history of ovarian cancer    Laryngopharyngeal reflux    Mild depression (HCC)    Osteopenia of multiple sites    Estrogen deficiency  -     BONE DENSITY SPINE/HIP; Future    Fatigue, unspecified type  -     THYROID STIMULATING HORMONE-TSH; Future    Screening for cardiovascular condition  -     CT CARDIAC CALCIUM SCORE WO CONT; Future    Other orders  -     PNEUMOCOCCAL VACCINE 23-VAL  -     sertraline (ZOLOFT) 50 mg tablet; Take one tablet by mouth daily.  -     atorvastatin (LIPITOR) 20 mg tablet; Take one tablet by mouth daily.      Lab work today.  Pneumvoax-23 given today.  Will request dates of Shingrix.  Dexa - she will schedule at front desk.  Recommended Pepcid for GERD.  Discussed and encouraged breast self-exam monthly to screen for breast cancer.  Discussed and encouraged a regular exercise program and a healthy diet.  Schedule Mammos after Nov 5.  Recommended Cardioscore -she will call and schedule.    FOLLOW-UP:  1 year for wellness visit/comprehensive exam, or sooner if problems.

## 2019-08-20 ENCOUNTER — Encounter: Admit: 2019-08-20 | Discharge: 2019-08-20 | Payer: MEDICARE

## 2019-08-23 ENCOUNTER — Encounter: Admit: 2019-08-23 | Discharge: 2019-08-23 | Payer: MEDICARE

## 2019-08-23 DIAGNOSIS — E785 Hyperlipidemia, unspecified: Secondary | ICD-10-CM

## 2019-08-23 DIAGNOSIS — Z136 Encounter for screening for cardiovascular disorders: Secondary | ICD-10-CM

## 2019-08-29 ENCOUNTER — Encounter: Admit: 2019-08-29 | Discharge: 2019-08-29 | Payer: MEDICARE

## 2019-08-29 DIAGNOSIS — R7989 Other specified abnormal findings of blood chemistry: Secondary | ICD-10-CM

## 2019-08-30 ENCOUNTER — Ambulatory Visit: Admit: 2019-08-30 | Discharge: 2019-08-30 | Payer: MEDICARE

## 2019-08-30 ENCOUNTER — Encounter: Admit: 2019-08-30 | Discharge: 2019-08-30 | Payer: MEDICARE

## 2019-08-30 DIAGNOSIS — Z1231 Encounter for screening mammogram for malignant neoplasm of breast: Secondary | ICD-10-CM

## 2019-09-02 ENCOUNTER — Encounter: Admit: 2019-09-02 | Discharge: 2019-09-02 | Payer: MEDICARE

## 2019-09-02 DIAGNOSIS — E785 Hyperlipidemia, unspecified: Secondary | ICD-10-CM

## 2019-09-05 ENCOUNTER — Encounter: Admit: 2019-09-05 | Discharge: 2019-09-05 | Payer: MEDICARE

## 2019-09-05 ENCOUNTER — Ambulatory Visit: Admit: 2019-09-05 | Discharge: 2019-09-05 | Payer: MEDICARE

## 2019-09-05 DIAGNOSIS — Z136 Encounter for screening for cardiovascular disorders: Secondary | ICD-10-CM

## 2019-09-05 DIAGNOSIS — E785 Hyperlipidemia, unspecified: Secondary | ICD-10-CM

## 2019-09-05 DIAGNOSIS — R7989 Other specified abnormal findings of blood chemistry: Secondary | ICD-10-CM

## 2019-09-06 ENCOUNTER — Encounter: Admit: 2019-09-06 | Discharge: 2019-09-06 | Payer: MEDICARE

## 2019-09-06 DIAGNOSIS — R945 Abnormal results of liver function studies: Secondary | ICD-10-CM

## 2019-09-17 ENCOUNTER — Encounter: Admit: 2019-09-17 | Discharge: 2019-09-17 | Payer: MEDICARE

## 2019-09-17 ENCOUNTER — Ambulatory Visit: Admit: 2019-09-17 | Discharge: 2019-09-17 | Payer: MEDICARE

## 2019-09-17 DIAGNOSIS — M8589 Other specified disorders of bone density and structure, multiple sites: Secondary | ICD-10-CM

## 2019-09-17 DIAGNOSIS — E2839 Other primary ovarian failure: Secondary | ICD-10-CM

## 2019-09-18 ENCOUNTER — Encounter: Admit: 2019-09-18 | Discharge: 2019-09-18 | Payer: MEDICARE

## 2019-09-18 DIAGNOSIS — M8589 Other specified disorders of bone density and structure, multiple sites: Secondary | ICD-10-CM

## 2019-09-22 ENCOUNTER — Encounter: Admit: 2019-09-22 | Discharge: 2019-09-22 | Payer: MEDICARE

## 2019-10-03 ENCOUNTER — Ambulatory Visit: Admit: 2019-10-03 | Discharge: 2019-10-03 | Payer: MEDICARE

## 2019-10-03 ENCOUNTER — Encounter: Admit: 2019-10-03 | Discharge: 2019-10-03 | Payer: MEDICARE

## 2019-10-03 DIAGNOSIS — R945 Abnormal results of liver function studies: Secondary | ICD-10-CM

## 2019-10-03 LAB — LIVER FUNCTION PANEL
Lab: 0.2 mg/dL (ref <0.4)
Lab: 0.9 mg/dL (ref 0.3–1.2)
Lab: 4.1 g/dL (ref 3.5–5.0)
Lab: 43 U/L — ABNORMAL HIGH (ref 7–40)
Lab: 6.7 g/dL (ref 6.0–8.0)
Lab: 70 U/L — ABNORMAL HIGH (ref 7–56)
Lab: 82 U/L (ref 25–110)

## 2019-10-04 ENCOUNTER — Encounter: Admit: 2019-10-04 | Discharge: 2019-10-04 | Payer: MEDICARE

## 2019-10-04 DIAGNOSIS — R945 Abnormal results of liver function studies: Secondary | ICD-10-CM

## 2019-10-04 MED ORDER — SERTRALINE 50 MG PO TAB
ORAL_TABLET | Freq: Every day | 0 refills | Status: DC
Start: 2019-10-04 — End: 2020-01-06

## 2019-12-21 ENCOUNTER — Encounter: Admit: 2019-12-21 | Discharge: 2019-12-21 | Payer: MEDICARE

## 2019-12-21 ENCOUNTER — Ambulatory Visit: Admit: 2019-12-21 | Discharge: 2019-12-22 | Payer: MEDICARE

## 2019-12-21 MED ORDER — COVID-19 VACC,MRNA(PFIZER)(PF) 30 MCG/0.3 ML IM SUSR
30 ug | Freq: Once | INTRAMUSCULAR | 0 refills | Status: CP
Start: 2019-12-21 — End: ?
  Administered 2019-12-21: 18:00:00 30 ug via INTRAMUSCULAR

## 2019-12-22 DIAGNOSIS — Z23 Encounter for immunization: Secondary | ICD-10-CM

## 2019-12-23 ENCOUNTER — Encounter: Admit: 2019-12-23 | Discharge: 2019-12-23 | Payer: MEDICARE

## 2020-01-04 ENCOUNTER — Encounter: Admit: 2020-01-04 | Discharge: 2020-01-04 | Payer: MEDICARE

## 2020-01-06 MED ORDER — SERTRALINE 50 MG PO TAB
ORAL_TABLET | Freq: Every day | 0 refills | Status: DC
Start: 2020-01-06 — End: 2020-04-10

## 2020-01-11 ENCOUNTER — Ambulatory Visit: Admit: 2020-01-11 | Discharge: 2020-01-12 | Payer: MEDICARE

## 2020-01-11 MED ORDER — COVID-19 VACC,MRNA(PFIZER)(PF) 30 MCG/0.3 ML IM SUSR
30 ug | Freq: Once | INTRAMUSCULAR | 0 refills | Status: CP
Start: 2020-01-11 — End: ?
  Administered 2020-01-11: 17:00:00 30 ug via INTRAMUSCULAR

## 2020-01-13 ENCOUNTER — Encounter: Admit: 2020-01-13 | Discharge: 2020-01-13 | Payer: MEDICARE

## 2020-01-13 ENCOUNTER — Ambulatory Visit: Admit: 2020-01-13 | Discharge: 2020-01-13 | Payer: MEDICARE

## 2020-01-13 DIAGNOSIS — R945 Abnormal results of liver function studies: Secondary | ICD-10-CM

## 2020-01-13 LAB — LIVER FUNCTION PANEL
Lab: 0.1 mg/dL (ref <0.4)
Lab: 0.5 mg/dL (ref 0.3–1.2)
Lab: 33 U/L (ref 7–40)
Lab: 4.2 g/dL (ref 3.5–5.0)
Lab: 41 U/L (ref 7–56)
Lab: 6.7 g/dL (ref 6.0–8.0)
Lab: 90 U/L (ref 25–110)

## 2020-01-14 ENCOUNTER — Encounter: Admit: 2020-01-14 | Discharge: 2020-01-14 | Payer: MEDICARE

## 2020-01-26 ENCOUNTER — Encounter: Admit: 2020-01-26 | Discharge: 2020-01-26 | Payer: MEDICARE

## 2020-04-10 ENCOUNTER — Encounter: Admit: 2020-04-10 | Discharge: 2020-04-10 | Payer: MEDICARE

## 2020-04-10 MED ORDER — SERTRALINE 50 MG PO TAB
ORAL_TABLET | Freq: Every day | 0 refills | Status: AC
Start: 2020-04-10 — End: ?

## 2020-08-05 ENCOUNTER — Encounter: Admit: 2020-08-05 | Discharge: 2020-08-05 | Payer: MEDICARE

## 2020-08-05 MED ORDER — SERTRALINE 50 MG PO TAB
ORAL_TABLET | Freq: Every day | 0 refills | Status: AC
Start: 2020-08-05 — End: ?

## 2020-08-18 ENCOUNTER — Encounter: Admit: 2020-08-18 | Discharge: 2020-08-18 | Payer: MEDICARE

## 2020-08-21 ENCOUNTER — Ambulatory Visit: Admit: 2020-08-21 | Discharge: 2020-08-22 | Payer: MEDICARE

## 2020-08-21 ENCOUNTER — Encounter: Admit: 2020-08-21 | Discharge: 2020-08-21 | Payer: MEDICARE

## 2020-08-21 DIAGNOSIS — R490 Dysphonia: Secondary | ICD-10-CM

## 2020-08-21 DIAGNOSIS — K219 Gastro-esophageal reflux disease without esophagitis: Secondary | ICD-10-CM

## 2020-08-21 DIAGNOSIS — F32A Depression: Secondary | ICD-10-CM

## 2020-08-21 DIAGNOSIS — Z0000 Encounter for general adult medical examination without abnormal findings: Secondary | ICD-10-CM

## 2020-08-21 DIAGNOSIS — E785 Hyperlipidemia, unspecified: Secondary | ICD-10-CM

## 2020-08-21 DIAGNOSIS — E2839 Other primary ovarian failure: Secondary | ICD-10-CM

## 2020-08-21 DIAGNOSIS — R5383 Other fatigue: Secondary | ICD-10-CM

## 2020-08-21 DIAGNOSIS — R112 Nausea with vomiting, unspecified: Secondary | ICD-10-CM

## 2020-08-21 DIAGNOSIS — Z803 Family history of malignant neoplasm of breast: Secondary | ICD-10-CM

## 2020-08-21 DIAGNOSIS — F320 Major depressive disorder, single episode, mild: Secondary | ICD-10-CM

## 2020-08-21 DIAGNOSIS — Z87898 Personal history of other specified conditions: Secondary | ICD-10-CM

## 2020-08-21 DIAGNOSIS — M8589 Other specified disorders of bone density and structure, multiple sites: Secondary | ICD-10-CM

## 2020-08-21 LAB — CBC AND DIFF
Lab: 0 10*3/uL (ref 0–0.20)
Lab: 0.1 10*3/uL (ref 0–0.45)
Lab: 0.6 10*3/uL (ref 0–0.80)
Lab: 1 % — ABNORMAL LOW (ref >60)
Lab: 1.8 10*3/uL (ref 1.0–4.8)
Lab: 12 % (ref 4–12)
Lab: 13 % (ref 11–15)
Lab: 14 g/dL (ref 12.0–15.0)
Lab: 2 % (ref 0–5)
Lab: 2.8 10*3/uL (ref >60)
Lab: 207 10*3/uL (ref 150–400)
Lab: 29 pg (ref 26–34)
Lab: 33 g/dL (ref 32.0–36.0)
Lab: 34 % (ref 24–44)
Lab: 4.9 M/UL (ref 4.0–5.0)
Lab: 43 % (ref 36–45)
Lab: 5.5 K/UL — ABNORMAL HIGH (ref <100)
Lab: 51 % (ref 41–77)
Lab: 7.7 FL (ref 7–11)
Lab: 87 FL — ABNORMAL HIGH (ref 80–100)

## 2020-08-21 LAB — LIPID PROFILE
Lab: 152 mg/dL — ABNORMAL HIGH (ref <150)
Lab: 175 mg/dL (ref <200)

## 2020-08-21 LAB — COMPREHENSIVE METABOLIC PANEL: Lab: 143 MMOL/L (ref >40)

## 2020-08-21 LAB — THYROID STIMULATING HORMONE-TSH: Lab: 0.9 uU/mL (ref 0.35–5.00)

## 2020-08-21 MED ORDER — ATORVASTATIN 20 MG PO TAB
20 mg | ORAL_TABLET | Freq: Every day | ORAL | 3 refills | Status: AC
Start: 2020-08-21 — End: ?

## 2020-08-21 MED ORDER — SERTRALINE 50 MG PO TAB
50 mg | ORAL_TABLET | Freq: Every day | ORAL | 3 refills | Status: AC
Start: 2020-08-21 — End: ?
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
## 2020-08-23 ENCOUNTER — Encounter: Admit: 2020-08-23 | Discharge: 2020-08-23 | Payer: MEDICARE

## 2020-09-14 ENCOUNTER — Ambulatory Visit: Admit: 2020-09-14 | Discharge: 2020-09-14 | Payer: MEDICARE

## 2020-09-14 ENCOUNTER — Encounter: Admit: 2020-09-14 | Discharge: 2020-09-14 | Payer: MEDICARE

## 2020-09-14 DIAGNOSIS — Z1231 Encounter for screening mammogram for malignant neoplasm of breast: Secondary | ICD-10-CM

## 2020-09-14 NOTE — Progress Notes
Results noted; benign.

## 2021-05-21 IMAGING — CR CHEST 2 VWS PA LAT
1 series · 2 of 2 positions shown · non-contrast
Comparison: 04/20/2018

HISTORY/INDICATIONS:  Annual physical exam
TECHNIQUE: Chest 2 views.

[Series 1: pa · 0.17mm/px · 2 of 2 slices shown]
[im 1/2]
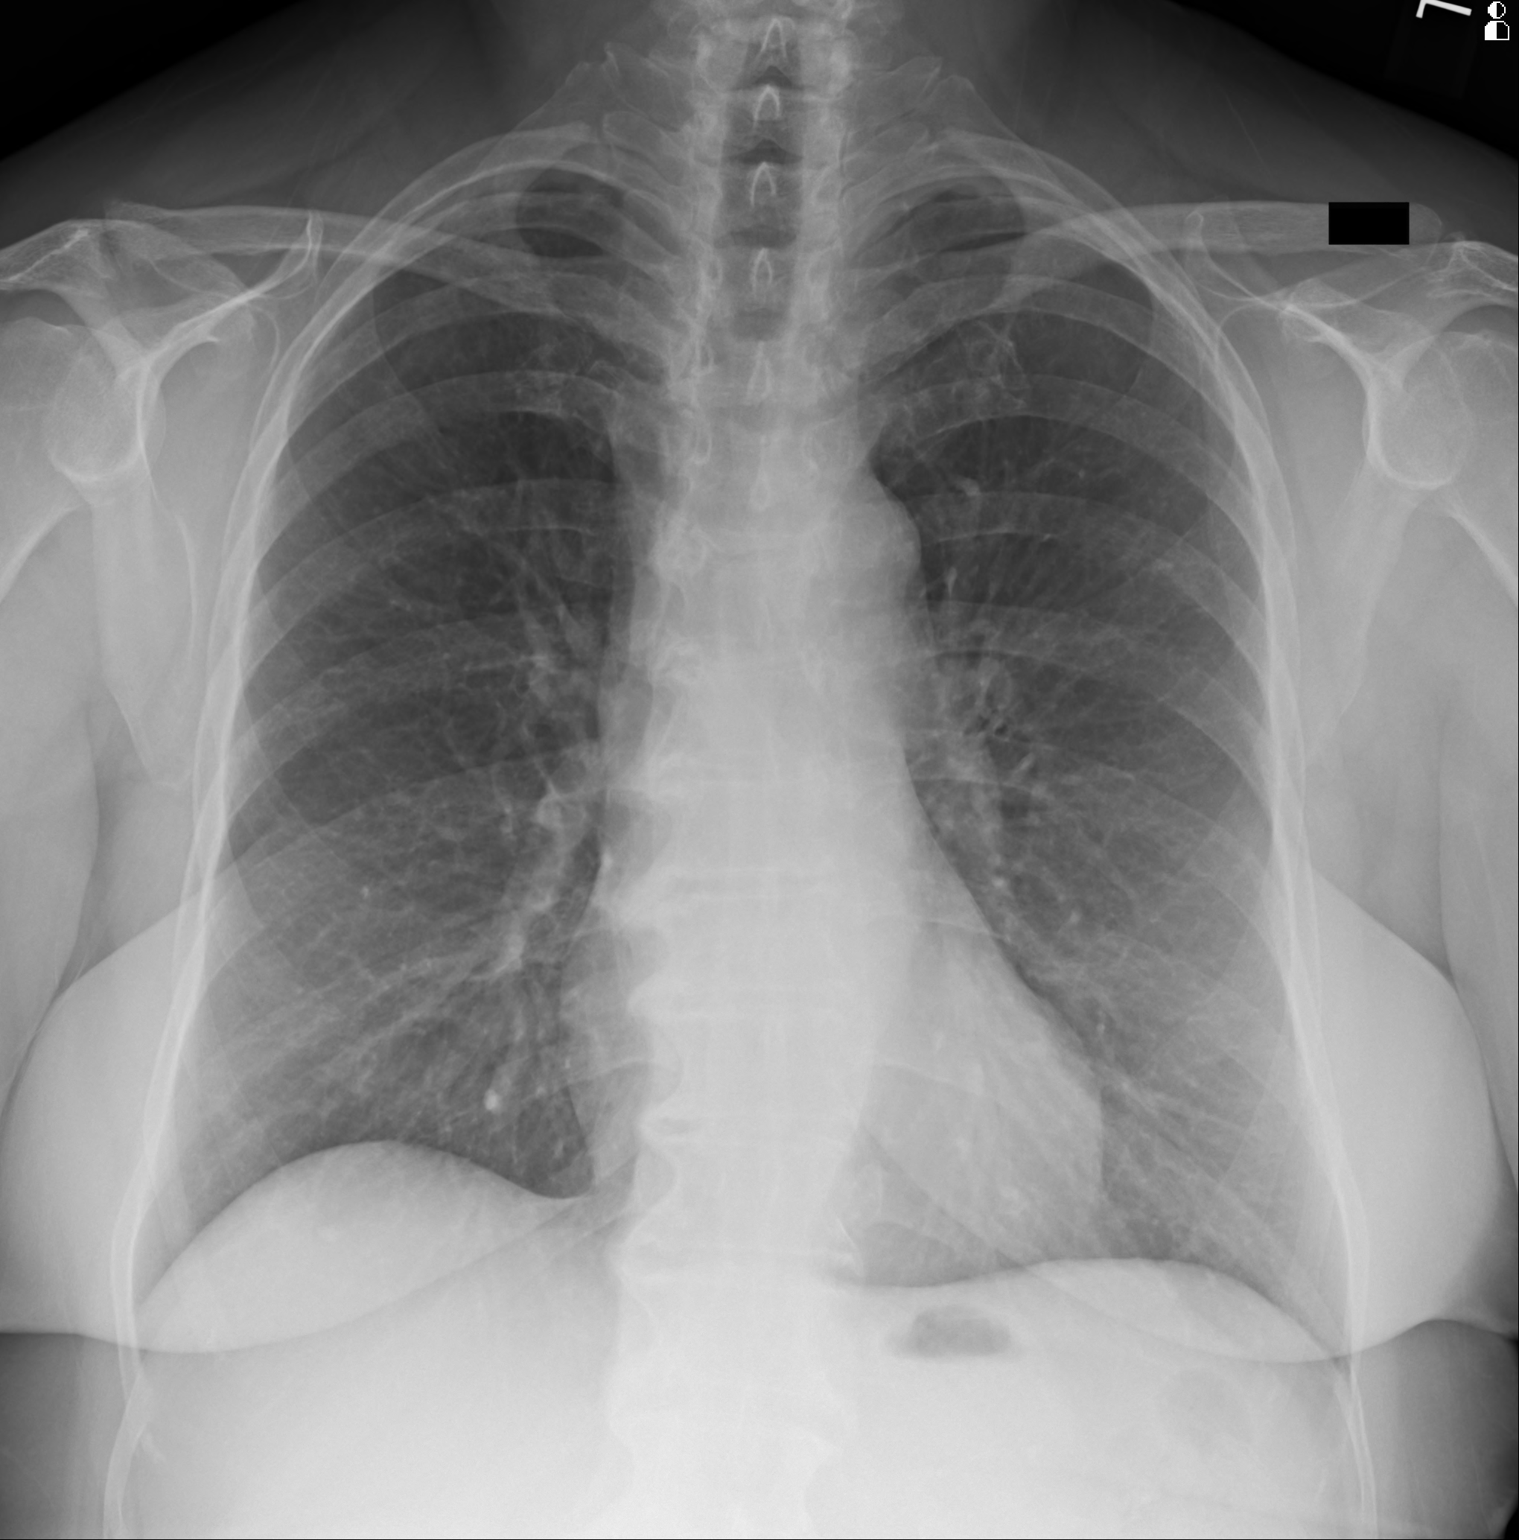
[im 2/2]
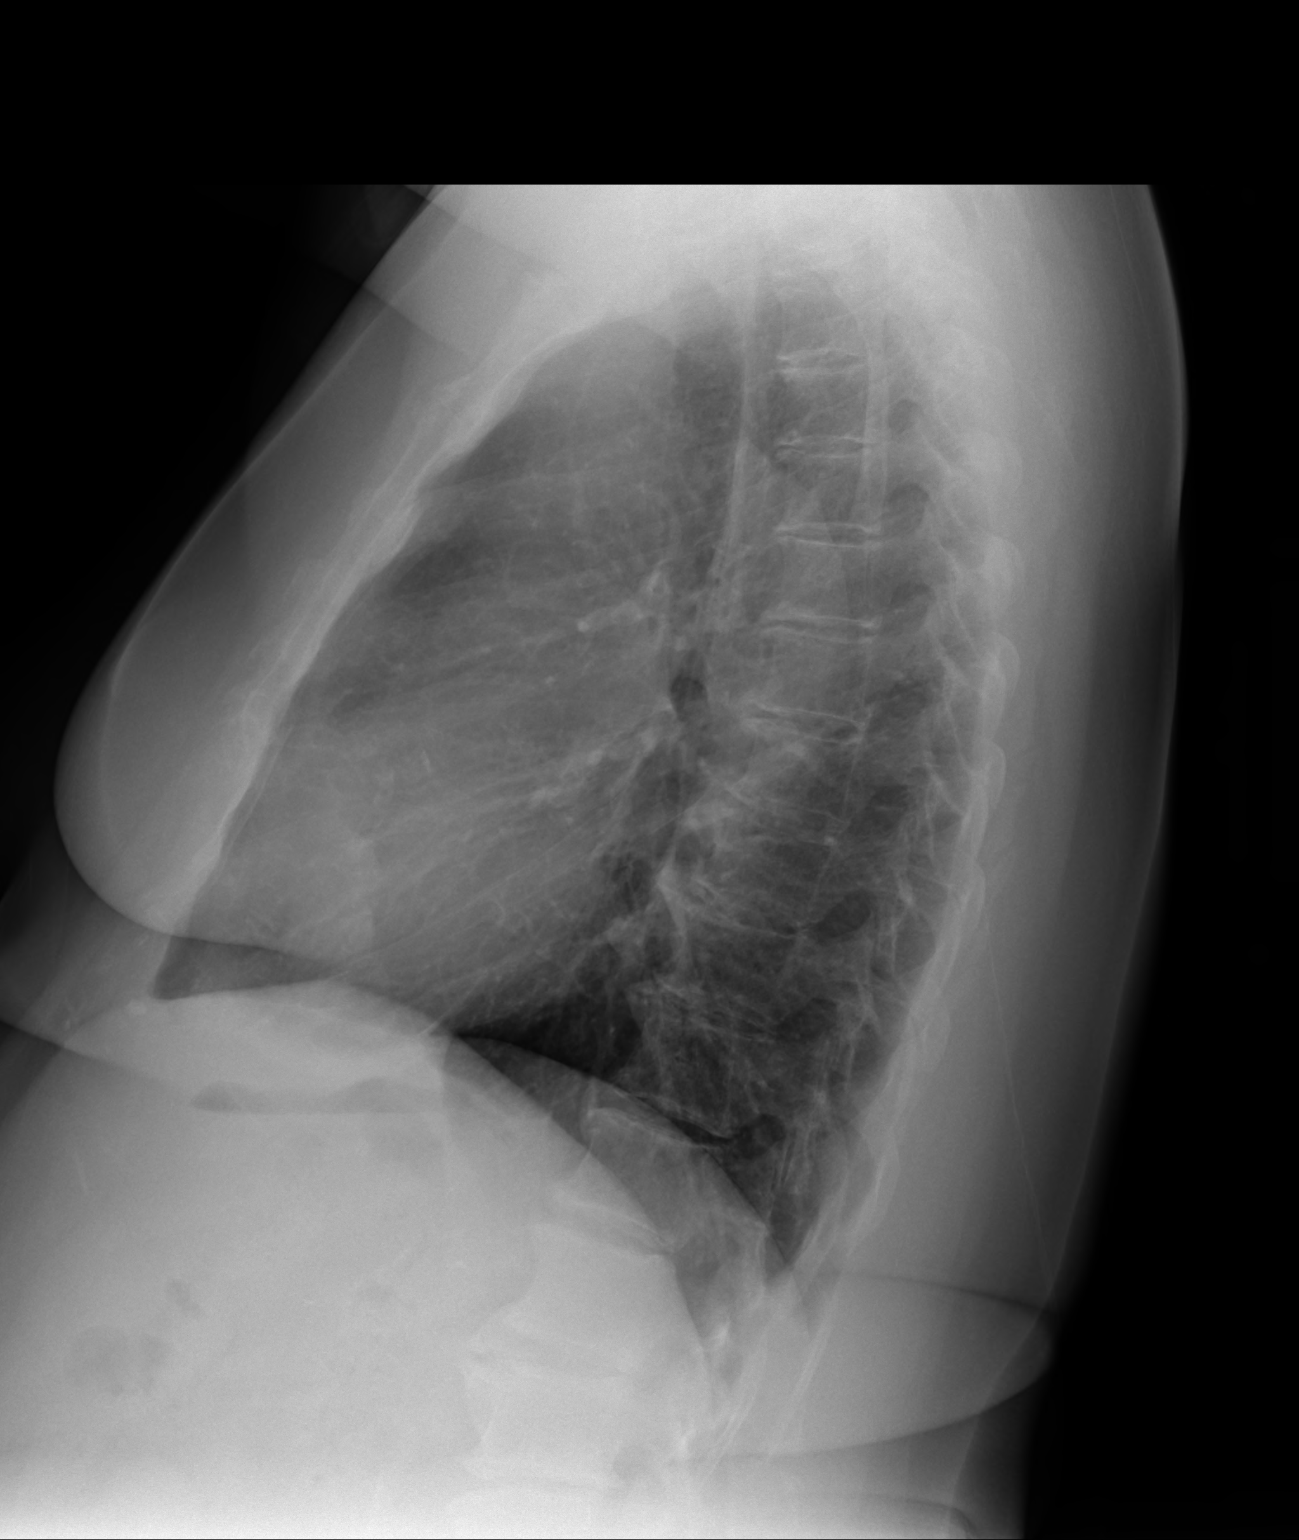

[2 of 2 positions shown; findings below may reference images not displayed]

FINDINGS: The cardiac silhouette is within normal limits in size. There is mild calcification about the aortic arch. The lungs are well aerated and clear without infiltrate, effusion or edema. There is stable mild apical parenchymal scarring. Degenerative disc type changes are noted in the thoracic spine.
IMPRESSION: Stable examination. No acute findings.

## 2021-08-27 ENCOUNTER — Encounter: Admit: 2021-08-27 | Discharge: 2021-08-27 | Payer: MEDICARE

## 2021-08-27 ENCOUNTER — Ambulatory Visit: Admit: 2021-08-27 | Discharge: 2021-08-27 | Payer: MEDICARE

## 2021-08-27 DIAGNOSIS — F32A Mild depression: Secondary | ICD-10-CM

## 2021-08-27 DIAGNOSIS — Z803 Family history of malignant neoplasm of breast: Secondary | ICD-10-CM

## 2021-08-27 DIAGNOSIS — E785 Hyperlipidemia, unspecified: Secondary | ICD-10-CM

## 2021-08-27 DIAGNOSIS — R112 Nausea with vomiting, unspecified: Secondary | ICD-10-CM

## 2021-08-27 DIAGNOSIS — Z0000 Encounter for general adult medical examination without abnormal findings: Secondary | ICD-10-CM

## 2021-08-27 DIAGNOSIS — M8589 Other specified disorders of bone density and structure, multiple sites: Secondary | ICD-10-CM

## 2021-08-27 DIAGNOSIS — Z87898 Personal history of other specified conditions: Secondary | ICD-10-CM

## 2021-08-27 DIAGNOSIS — K219 Gastro-esophageal reflux disease without esophagitis: Secondary | ICD-10-CM

## 2021-08-27 DIAGNOSIS — R5383 Other fatigue: Secondary | ICD-10-CM

## 2021-08-27 DIAGNOSIS — E2839 Other primary ovarian failure: Secondary | ICD-10-CM

## 2021-08-27 DIAGNOSIS — Z8041 Family history of malignant neoplasm of ovary: Secondary | ICD-10-CM

## 2021-08-27 LAB — CBC AND DIFF
HEMATOCRIT: 41 % (ref 36–45)
HEMOGLOBIN: 14 g/dL (ref 12.0–15.0)
MCV: 86 FL (ref 80–100)
RBC COUNT: 4.7 M/UL (ref <100)
WBC COUNT: 4.9 K/UL (ref >40)

## 2021-08-27 LAB — COMPREHENSIVE METABOLIC PANEL: SODIUM: 143 MMOL/L (ref <150)

## 2021-08-27 LAB — LIPID PROFILE: CHOLESTEROL: 149 mg/dL (ref <200)

## 2021-08-27 LAB — THYROID STIMULATING HORMONE-TSH: TSH: 1.8 uU/mL (ref 0.35–5.00)

## 2021-08-27 NOTE — Progress Notes
Health Risk Assessment Questionnaire  Current Care  List of Providers you have seen in the last two years: Dr Lyla Son Zylie Mumaw,;Dr Leonia Reeves, DDS  Are you receiving home health?: No  During the past 4 weeks, how would you rate your health in general?: Very Good    Outside Care  Since your last PCP visit, have you received care outside of [facility]?: No    Physical Activity  Do you exercise or are you physically active?: Yes  How many days a week do you usually exercise or are physically active?: (P) 3  On days when you exercised or were physically active, how many minutes was the activity?: (P) 30  During the past four weeks, what was the hardest physical activity you could do for at least two minutes?: (P) Moderate    Diet  In the past month, were you worried whether your food would run out before you or your family had money to buy more?: No  In the past 7 days, how many times did you eat fast food or junk food or pizza?: (!) 2  In the past 7 days, how many servings of fruits or vegetables did you eat each day?: (!) 0-1  In the past 7 days, how many sodas and sugar sweetened drinks (regular, not diet) did you drink each day?: 0    Smoke/Tobacco Use  Are you currently a smoker?: No    Alcohol Use  Do you drink alcohol?: Yes  Are you Female or Female?: (P) Female  Female: In the last three months, have you had >3 alcoholic beverages in any one day or >7 in any one week?: (P) No    Depression Screen  Little interest or pleasure in doing things: Not at All  Feeling down, depressed or hopeless: Not at All    Pain  How would you rate your pain today?: No pain    Ambulation  Do you use any assistive devices for ambulation?: No    Fall Risk  Does it take you longer than 30 seconds to get up and out of a chair?: No  Have you fallen in the past year?: (!) (P) Yes  Fall History (last 71mo): (!) (P) One Fall without Major Injury    Motor Vehicle Safety  Do you fasten your seat belt when you are in the car?: Yes    Sun Exposure  Do you protect yourself from the sun? For example, wear sunscreen when outside.: Yes    Hearing Loss  Do you have trouble hearing the television or radio when others do not?: (!) Yes  Do you have to strain or struggle to hear/understand conversation?: (!) Yes  Do you use hearing aids?: No    Urinary Incontinence  Have you had urine leakage in the past 6 months?: (!) Yes  Does your urine leakage negatively impact your daily activities or sleep?: No    Cognitive Impairment  During the past 12 months, have you experienced confusion or memory loss that is happening more often or is getting worse?: No    Functional Screen  Do you live alone?: Yes  Do you live at: Home  Can you drive your own car or travel alone by bus or taxi?: Yes  Can you shop for groceries or clothes without help?: Yes  Can you prepare your own meals?: Yes  Can you do your own housework without help?: Yes  Can you handle your own money without help?:  Yes  Do you need help eating, bathing, dressing, or getting around your home?: No  Do you feel safe?: Yes  Does anyone at home hurt you, hit you, or threaten you?: No  Have you ever been the victim of abuse?: No    Home Safety  Does your home have grab bars in the bathroom?: (!) No  Does your home have hand rails on stairs and steps?: Yes  Does your home have functioning smoke alarms?: Yes    Advance Directive  Do you have a living will or Advance Directive?: Yes    Dental Screen  Have you had an exam by your dentist in the last year?: Yes    Vision Screen  Do you have diabetes?: No    She presents today for an Annual Medicare Wellness visit.  Past, family, social hx reviewed with pt and documented.   A Health Risk assessment was performed by the patient today, reviewed with the patient and the results are above.    Other health care providers: see above  Special diet:   No Added Salt yes    Low fat yes    Carbohydrate counting no  She does not have cognitive dysfunction. PHQ-2 depression screening - 0  She does report hearing loss.   is generally independent in ADLs   Home safety screen assessed: (smoke alarms, railings on stairs, grab bar in bathroom) Yes  She issteady on her feet and able to perform the get up and go test promptly.   We did discuss end of life issues in the past.She does have a living will and will provide a copy of the document to have scanned into the medical record.  A lipid panel has been done in the past 5 years.  For colon cancer screening the patient has had a colonoscopy within the past 10 years.   Fasting blood sugar ordered.   Glaucoma screening recommended for at risk individuals.     For female patients only:   Not at risk high risk cervical cancer.  No history of cervical dysplasia within the past 20  years and last 3 paps reportedly normal/ testing deferred.    Mammogram iscurrent and recommended annually.   She has been screened for osteoporosis with a bone density   She reports trouble with stress incontinence. Recommendations include:discussed, patient declines intervention    Records reflect the pneumococcal vaccine and tdap are current.   Personal prevention reviewed with patient and a copy was given via the After Visit Summary.    While the patient was here today, due to his/her multiple chronic conditions it would be in the best interest of the patient for me to monitor, assess and evaluate those as well.     Last seen a year ago for a comprehensive exam/Medicare AWV.    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  2 grandkids,, here in [facility].  Other grandkids were in [place] but moved here in 2021.     She reports that daughter, son-in-law, and 2 grandsons moved to [facility] from [place] last year.  Daughter is happy with the job transfer, same company.  Grandsons ages 46 and 23 have adjusted very well to the move, they are bilingual and so have had no trouble transitioning to the [place] language.  Son-in-law has had more difficulty, he is [place] and does not speak English very well, and has not worked on getting more adept at [place] which has been frustrating.  Their house is also been a money pit which has been stressful.  Rebekah Hill has been able to visit them regularly and has enjoyed this.     Her mother age 69 is in [place] and she goes to visit her about once a month.  She lost her father, age 22, this year when he fell and hit his head.  Mother still living independently.  Rebekah Hill has 2 sisters who live in [place] and look in on mother as well.    Rebekah Hill has noticed more hearing problems this year and would like to have hearing tests and consider hearing aids.  We discussed options.    Some exercise-plays soccer once a week, walks.  Also chases her young grandkids ages 40 and 3 you are here in town.    Still with chronic vertigo, but mild in the last year no severe flareups.    Feels good overall.    H/o hyperlipidemia, treated with Atorvastatin.  Cardiac calcium score zero in 08/2019.     H/o osteopenia; had left wrist fracture 2017  Taking Caltrate.  Dexa 2017 - osteopenia.  Dexa 08/2019 - osteopenia.  FRAX hip 2.3%, risk of any fracture 16.5%.     Has had vertigo off and on for years, often very severe.    In 2021 she noted that she recently she has noticed some mild very short limited vertigo when she turns over in bed to her left, lasts for about 15 seconds, then resolves.  She does not have any when she turns to her right.     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  She has tried various medicines for this but it has not helped, so she does not take any medication regularly.     Saw [facility] in 2018 for hoarseness.  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  Did vocal exercises for a year - no help, so she stopped them     Has mild depression, controlled with Sertraline.       Has FH breast ca; she is up to date on Mammogram.     H/o abnl Pap in past but had hysterectomy in 2019.     H/o hyperlipidemia, treated with Atorvastatin.  Cardiac calcium score zero in 08/2019.     Health maint items reviewed and discussed:  -Dexa - after Nov 24  -Prevnar 20  -Covid booster  -Mammo after Nov 22    Past medical history, medications, medication allergies, social and family history are all reviewed.    Patient Active Problem List    Diagnosis Date Noted   ? Dysphonia 07/25/2017   ? 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 Osteopenia 12/30/2015   ? Family history of breast cancer in sister 07/21/2015   ? Family history of ovarian cancer 07/21/2015   ? Hyperlipidemia, unspecified    ? Mild depression      MED LIST:  Outpatient Encounter Medications as of 08/27/2021   Medication Sig Dispense Refill   ? atorvastatin (LIPITOR) 20 mg tablet Take one tablet by mouth daily. 90 tablet 3   ? Biotin 1 mg tab Take  by mouth daily.     ? DOCOSAHEXANOIC ACID/EPA (FISH OIL PO) Take  by mouth daily.     ? ERGOCALCIFEROL (VITAMIN D2) (VITAMIN D PO) Take 2,000 Units by mouth daily.     ? GLUCOSAMINE HCL/CHONDR  SU A NA (OSTEO BI-FLEX PO) Take 1 mg by mouth daily.     ? [DISCONTINUED] MULTIVITAMIN W-MINERALS/LUTEIN (CENTRUM SILVER PO) Take  by mouth daily.     ? sertraline (ZOLOFT) 50 mg tablet Take one tablet by mouth daily. 90 tablet 3     No facility-administered encounter medications on file as of 08/27/2021.     ROS:  Constitutional: energy level low.  Weight stable. No fever.  No chills.  Eyes:  No vision problems, no drainage.  Ears/Nose/Mouth/Throat:  No hearing difficulties.  No congestion.  No oral ulcers.  No sore throat.  No difficulty swallowing.  CV: No chest pain or palpitations.  Resp: No shortness of breath or cough.  GI: No abdominal pain.  Bowels normal, with no hematochezia or melena.  GU: no dysuria.  No urinary frequency.  No significant incontinence.  MS:  No significant joint or muscle pain.  SKIN:  Has noted no new moles or changes in moles.  BREASTS:  Completes breast self-exam regularly and has not noticed any changes.  NEURO:  No tingling, numbness, weakness, or other complaints.  PSYCH: Mood good, denies depression or anxiety.  Sleeps well.  ENDO: no vaginal bleeding.  No vaginal discharge.    Objective  Vitals:    08/27/21 1035   BP: 119/86   BP Source: Arm, Left Upper   Pulse: 62   Temp: 36.8 ?C (98.2 ?F)   Resp: 16   SpO2: 99%   TempSrc: Tympanic   PainSc: Zero   Weight: 56.8 kg (125 lb 3.2 oz)   Height: 151.8 cm (4' 11.75)     Body mass index is 24.66 kg/m?Marland Kitchen    Constitutional: Alert, well nourished, in no distress.    Eyes:  PERRLA, EOMI.  Conjunctiva are non-icteric and are no injected.  ENT: External ear canals are negative.  TM's are clear bilat, with no erythema.     ENDO:  No thyroid enlargement or nodules.  BREASTS:  Mild to moderate fibrocystic changes bilat, with no dominant mass or nodule.  No skin abnormality.  No axillary adenopathy.  No nipple discharge.  CV: Heart exam shows regular rhythm, no murmur, no S3, no S4.  Carotid pulses are 2/4 and equal bilat, with no carotid bruits.    RESP: Lungs are clear to auscultation bilat, with no rales, rhonchi, or wheezing.  GI: Normal bowel sounds.  Abdomen is soft, nontender.  No hepatosplenomegaly, no mass.   MS: no joint swelling or erythema.  SKIN:  No rash, no abnormal appearing moles.  NEURO:  CN's 2-12 intact; strength is equal bilat.  PSYCH:  Good eye contact, normal affect.  LYMPH:  No significant LAD in neck, axillary or groin areas.    ASSESSMENT/PLAN:  Rebekah Hill was seen today for annual wellness visit and cholesterol.    Diagnoses and all orders for this visit:    Medicare annual wellness visit, subsequent    Hyperlipidemia, unspecified hyperlipidemia type  -     CBC AND DIFF; Future  -     COMPREHENSIVE METABOLIC PANEL; Future  -     LIPID PROFILE; Future    Fatigue, unspecified type  -     THYROID STIMULATING HORMONE-TSH; Future    Osteopenia of multiple sites    Mild depression    Laryngopharyngeal reflux    Family history of breast cancer in sister    Family history of ovarian cancer    Estrogen deficiency  -     BONE DENSITY  SPINE/HIP; Future    Other orders  -     PNEUMOCOCCAL VACCINE 20-VAL      Lab work today.  Prevnar 20 given today.  DEXA- faxed order to diagnostic imaging center.  Discussed audiology eval of hearing loss.  Encouraged to schedule mammogram.  Discussed and encouraged breast self-exam monthly to screen for breast cancer.  Discussed and encouraged a regular exercise program and a healthy diet.    FOLLOW-UP:  1 year for wellness visit/comprehensive exam, or sooner if problems.

## 2021-08-29 ENCOUNTER — Encounter: Admit: 2021-08-29 | Discharge: 2021-08-29 | Payer: MEDICARE

## 2021-09-19 ENCOUNTER — Encounter: Admit: 2021-09-19 | Discharge: 2021-09-19 | Payer: MEDICARE

## 2021-09-19 MED ORDER — ATORVASTATIN 20 MG PO TAB
ORAL_TABLET | Freq: Every day | 3 refills
Start: 2021-09-19 — End: ?

## 2021-09-19 MED ORDER — SERTRALINE 50 MG PO TAB
ORAL_TABLET | Freq: Every day | 3 refills
Start: 2021-09-19 — End: ?

## 2021-10-02 IMAGING — CR [HOSPITAL] FOOT RT 3VWS MIN
1 series · 3 of 3 positions shown · non-contrast
Comparison: Right foot x-rays 09/27/2018.

HISTORY: Right foot pain, trauma several weeks ago.
TECHNIQUE: Right foot x-rays standing 3 views.

[Series 1: lat · 0.17mm/px · 3 of 3 slices shown]
[im 1/3]
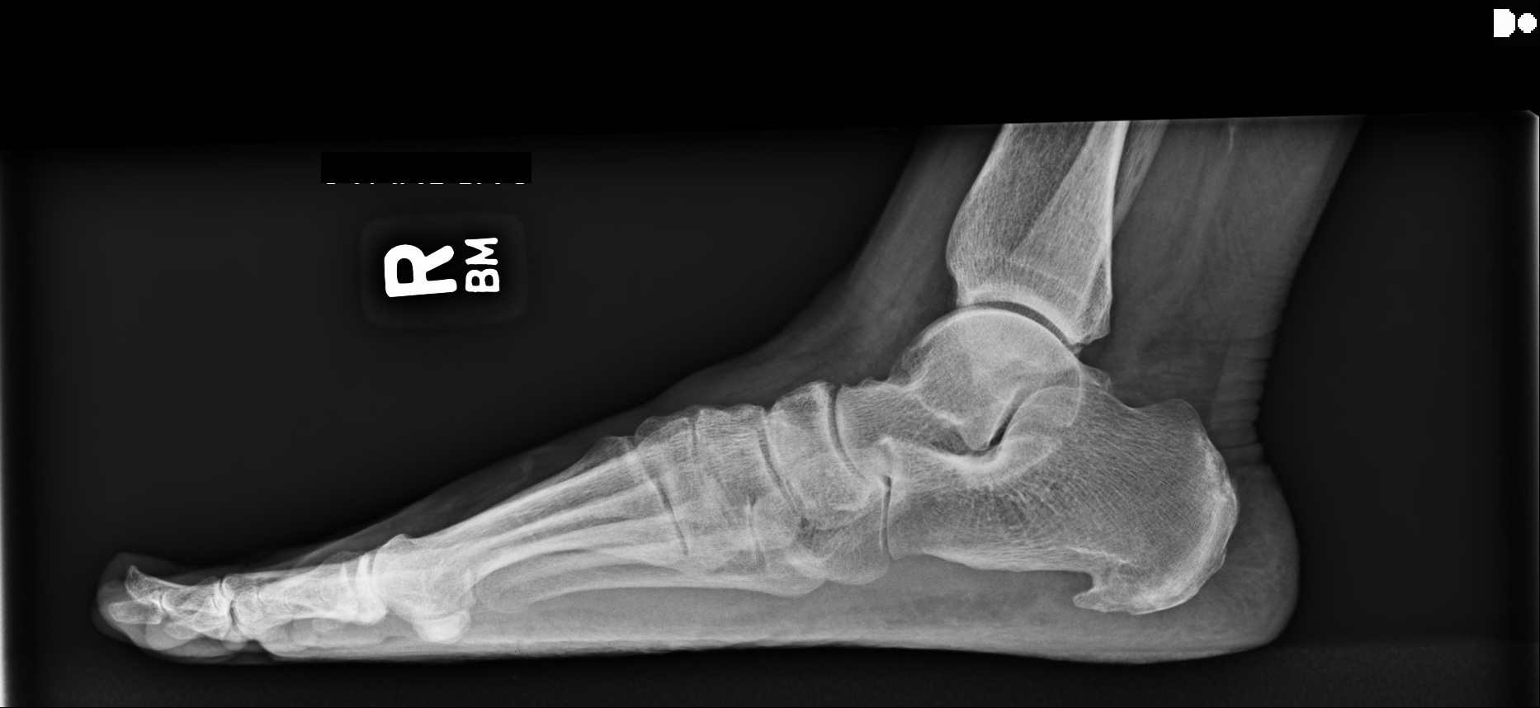
[im 2/3]
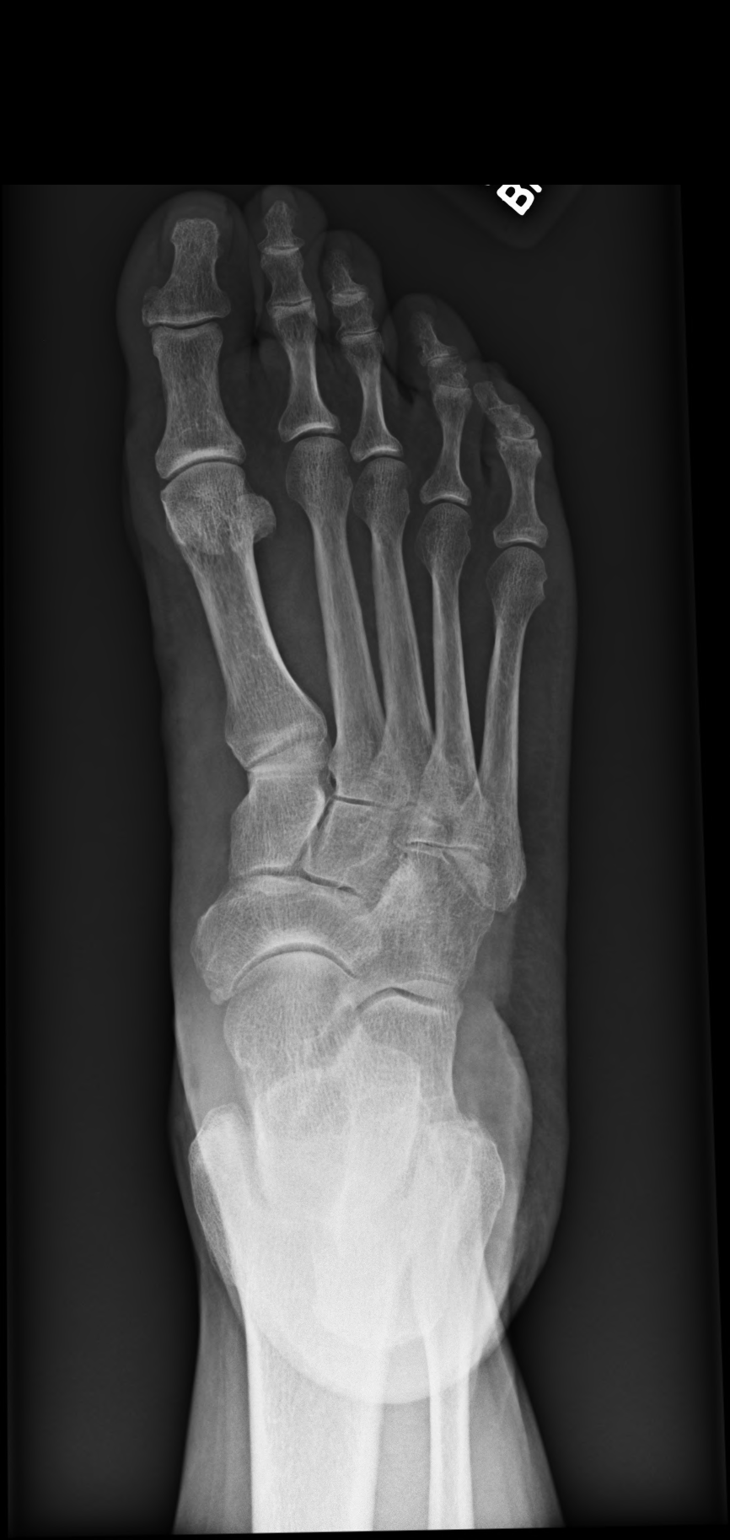
[im 3/3]
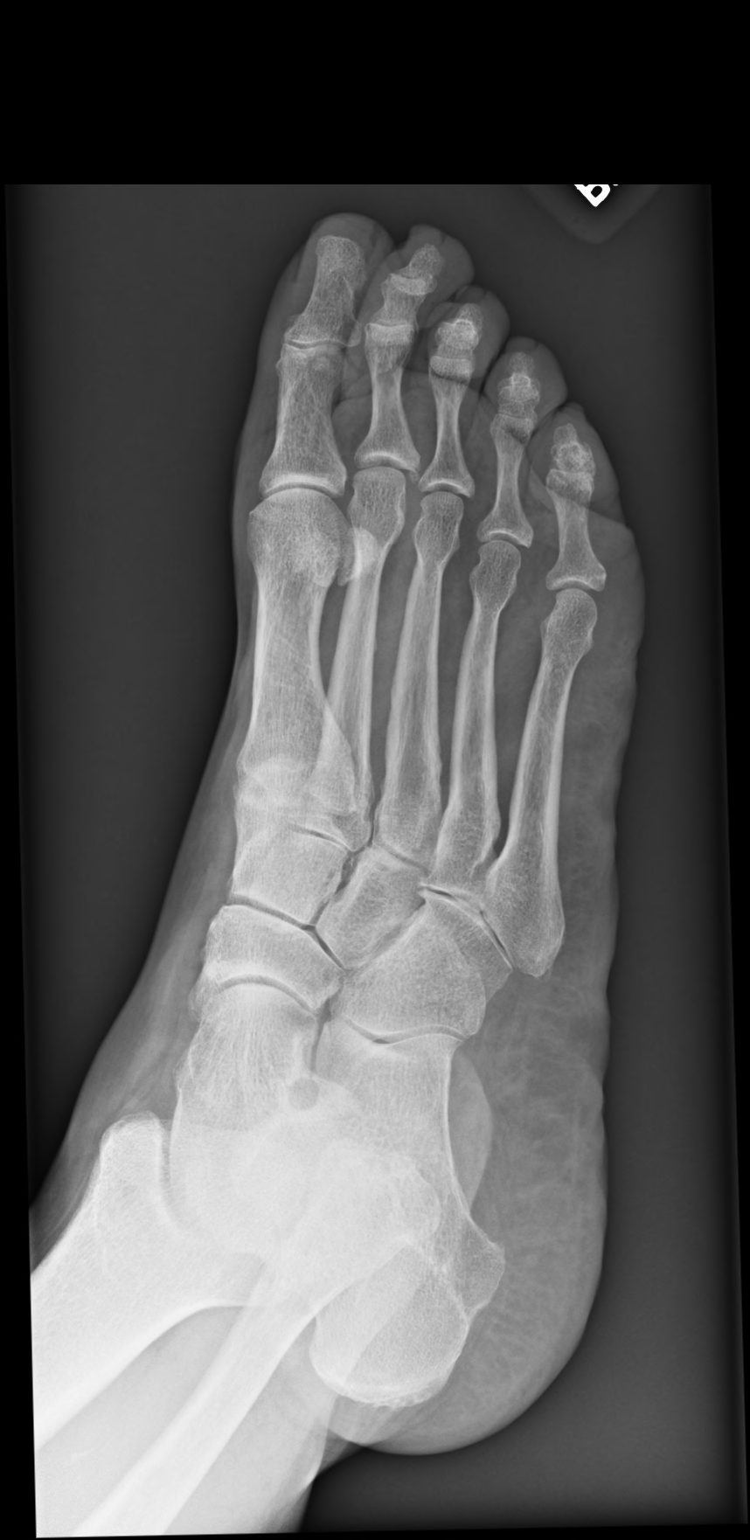

[3 of 3 positions shown; findings below may reference images not displayed]

FINDINGS: Again noted pes planus. Calcaneal enthesophyte is again seen measuring about 8 mm in length. Subtalar joint no obvious coalition.

Midfoot arthritic changes are again identified, particularly at the third, fourth and fifth tarsometatarsal joints. No acute fracture however, no erosion. No destructive process. Mild DJD MP joint big toe.
IMPRESSION: Stable right foot x-rays; pes planus, midfoot arthropathy, plantar spur, but no acute fracture or erosion seen. No significant soft tissue swelling seen either. No atherosclerosis seen.

## 2021-10-08 ENCOUNTER — Encounter: Admit: 2021-10-08 | Discharge: 2021-10-08 | Payer: MEDICARE

## 2021-10-15 ENCOUNTER — Encounter: Admit: 2021-10-15 | Discharge: 2021-10-15 | Payer: MEDICARE

## 2021-10-15 DIAGNOSIS — E2839 Other primary ovarian failure: Secondary | ICD-10-CM

## 2021-10-15 NOTE — Progress Notes
Results released to MyChart along with note:  I have reviewed your bone density report, and you should be able to view it in Mychart as soon as the staff have a chance to scan it in.    You have osteopenia in your spine and hips, but no sign of osteoporosis.    Your risk of a fracture due to osteoporosis in the next 10 years is calculated to be:  -Risk of a hip fracture: 4.4%.  -Risk of any osteoporotic fracture: 26.3%.    The osteoporosis experts recommend starting medication for osteoporosis if the risk of hip fracture is 3% or above, or risk of any fracture 20% or above, so we should consider starting medication.  I do not feel strongly about starting medication because you do not have osteoporosis on the scan, and I suspect your risk is overestimated because of your family history.  However, if you would like to consider starting on medication to prevent further bone thinning, please make an appointment and we can discuss options.    Continue with regular exercise, and make sure to get in enough calcium daily - either 3 servings of calcium rich foods (Foods rich in calcium: milk, cheese, ice cream, yogurt, cottage cheese, dark green leafy vegetables, almonds), or take a Calcium supplement:  500mg  or 600mg  daily.    Continue on a vitamin D supplement .    We should repeat the bone density test in 2 years.

## 2021-11-01 ENCOUNTER — Encounter: Admit: 2021-11-01 | Discharge: 2021-11-01 | Payer: MEDICARE

## 2021-11-01 ENCOUNTER — Ambulatory Visit: Admit: 2021-11-01 | Discharge: 2021-11-01 | Payer: MEDICARE

## 2021-11-01 DIAGNOSIS — Z1231 Encounter for screening mammogram for malignant neoplasm of breast: Secondary | ICD-10-CM

## 2022-08-30 ENCOUNTER — Encounter: Admit: 2022-08-30 | Discharge: 2022-08-30 | Payer: MEDICARE

## 2022-08-30 ENCOUNTER — Ambulatory Visit: Admit: 2022-08-30 | Discharge: 2022-08-31 | Payer: MEDICARE

## 2022-08-30 DIAGNOSIS — F32A Mild depression: Secondary | ICD-10-CM

## 2022-08-30 DIAGNOSIS — Z0000 Encounter for general adult medical examination without abnormal findings: Secondary | ICD-10-CM

## 2022-08-30 DIAGNOSIS — R112 Nausea with vomiting, unspecified: Secondary | ICD-10-CM

## 2022-08-30 DIAGNOSIS — M8589 Other specified disorders of bone density and structure, multiple sites: Secondary | ICD-10-CM

## 2022-08-30 DIAGNOSIS — Z87898 Personal history of other specified conditions: Secondary | ICD-10-CM

## 2022-08-30 DIAGNOSIS — E785 Hyperlipidemia, unspecified: Secondary | ICD-10-CM

## 2022-08-30 LAB — CBC AND DIFF
ABSOLUTE BASO COUNT: 0 K/UL (ref 0–0.20)
ABSOLUTE EOS COUNT: 0.1 K/UL (ref 0–0.45)
ABSOLUTE LYMPH COUNT: 1.4 K/UL (ref 1.0–4.8)
ABSOLUTE MONO COUNT: 0.5 K/UL (ref 0–0.80)
ABSOLUTE NEUTROPHIL: 2.8 K/UL (ref 1.8–7.0)
EOSINOPHILS %: 3 % (ref 0–5)
MCH: 29 pg (ref 26–34)
MCHC: 33 g/dL (ref 32.0–36.0)
MCV: 88 FL (ref 80–100)
PLATELET COUNT: 219 K/UL (ref 150–400)
RBC COUNT: 4.7 M/UL (ref <100)
RDW: 13 % (ref 11–15)
WBC COUNT: 5 K/UL (ref >40)

## 2022-08-30 LAB — THYROID STIMULATING HORMONE-TSH: TSH: 0.8 uU/mL (ref 0.35–5.00)

## 2022-08-30 LAB — COMPREHENSIVE METABOLIC PANEL
ALK PHOSPHATASE: 81 U/L (ref 25–110)
ALT: 25 U/L (ref 7–56)
AST: 25 U/L (ref 7–40)
CO2: 26 MMOL/L (ref 21–30)
EGFR: >60 mL/min (ref >60)
SODIUM: 143 MMOL/L (ref <150)

## 2022-08-30 LAB — LIPID PROFILE
CHOLESTEROL: 167 mg/dL (ref <200)
NON HDL CHOLESTEROL: 104 mg/dL (ref 36–45)
VLDL: 22 mg/dL (ref 12.0–15.0)

## 2022-08-30 MED ORDER — ATORVASTATIN 20 MG PO TAB
20 mg | ORAL_TABLET | Freq: Every day | ORAL | 3 refills | Status: AC
Start: 2022-08-30 — End: ?

## 2022-08-30 MED ORDER — SERTRALINE 50 MG PO TAB
50 mg | ORAL_TABLET | Freq: Every day | ORAL | 3 refills | Status: AC
Start: 2022-08-30 — End: ?

## 2022-08-30 NOTE — Progress Notes
Health Risk Assessment Questionnaire  Current Care  List of Providers you have seen in the last two years: Dr. Johny Hill  Are you receiving home health?: No  During the past 4 weeks, how would you rate your health in general?: Excellent    Outside Care  Since your last PCP visit, have you received care outside of [facility]?: No    Physical Activity  Do you exercise or are you physically active?: Yes  How many days a week do you usually exercise or are physically active?: (P) 5  On days when you exercised or were physically active, how many minutes was the activity?: (P) 120  During the past four weeks, what was the hardest physical activity you could do for at least two minutes?: (P) Heavy    Diet  In the past month, were you worried whether your food would run out before you or your family had money to buy more?: No  In the past 7 days, how many times did you eat fast food or junk food or pizza?: 1  In the past 7 days, how many servings of fruits or vegetables did you eat each day?: (!) 2-3  In the past 7 days, how many sodas and sugar sweetened drinks (regular, not diet) did you drink each day?: 0    Smoke/Tobacco Use  Are you currently a smoker?: No    Alcohol Use  Do you drink alcohol?: Yes  Are you Female or Female?: (P) Female  Female: In the last three months, have you had >3 alcoholic beverages in any one day or >7 in any one week?: (P) No    Depression Screen  Little interest or pleasure in doing things: Not at All  Feeling down, depressed or hopeless: Not at All    Pain  How would you rate your pain today?: Very mild pain    Ambulation  Do you use any assistive devices for ambulation?: No    Fall Risk  Does it take you longer than 30 seconds to get up and out of a chair?: No  Have you fallen in the past year?: (!) (P) Yes  Fall History (last 46mo): (!) (P) Two or More Falls    Motor Vehicle Safety  Do you fasten your seat belt when you are in the car?: Yes    Sun Exposure  Do you protect yourself from the sun? For example, wear sunscreen when outside.: Yes    Hearing Loss  Do you have trouble hearing the television or radio when others do not?: No  Do you have to strain or struggle to hear/understand conversation?: (!) Yes  Do you use hearing aids?: (!) Yes    Urinary Incontinence  Have you had urine leakage in the past 6 months?: (!) Yes  Does your urine leakage negatively impact your daily activities or sleep?: No    Cognitive Impairment  During the past 12 months, have you experienced confusion or memory loss that is happening more often or is getting worse?: No    Functional Screen  Do you live alone?: Yes  Do you live at: Home  Can you drive your own car or travel alone by bus or taxi?: Yes  Can you shop for groceries or clothes without help?: Yes  Can you prepare your own meals?: Yes  Can you do your own housework without help?: Yes  Can you handle your own money without help?: Yes  Do you need help  eating, bathing, dressing, or getting around your home?: No  Do you feel safe?: Yes  Does anyone at home hurt you, hit you, or threaten you?: No  Have you ever been the victim of abuse?: No    Home Safety  Does your home have grab bars in the bathroom?: (!) No  Does your home have hand rails on stairs and steps?: Yes  Does your home have functioning smoke alarms?: Yes    Advance Directive  Do you have a living will or Advance Directive?: Yes    Dental Screen  Have you had an exam by your dentist in the last year?: Yes    Vision Screen  Do you have diabetes?: No    She presents today for an Annual Medicare Wellness visit.  Past, family, social hx reviewed with pt and documented.   A Health Risk assessment was performed by the patient today, reviewed with the patient and the results are above.    Other health care providers: none  Special diet:   No Added Salt yes    Low fat yes    Carbohydrate counting no  She does not have cognitive dysfunction.   PHQ-2 depression screening - 0  She does report hearing loss.   is generally independent in ADLs   Home safety screen assessed: (smoke alarms, railings on stairs, grab bar in bathroom) Yes  She issteady on her feet and able to perform the get up and go test promptly.   We did discuss end of life issues in the past.She does have a living will and will provide a copy of the document to have scanned into the medical record.  A lipid panel has been done in the past 5 years.  For colon cancer screening the patient has had a colonoscopy within the past 10 years.   Fasting blood sugar ordered.   Glaucoma screening recommended for at risk individuals.     For female patients only:   Not at risk high risk cervical cancer.  No history of cervical dysplasia within the past 20  years and last 3 paps reportedly normal/ testing deferred.    Mammogram iscurrent and recommended annually.   She has been screened for osteoporosis with a bone density   She denies trouble with urinary incontinence.    Records reflect the pneumococcal vaccine and tdap are current.   Personal prevention reviewed with patient and a copy was given via the After Visit Summary.    While the patient was here today, due to his/her multiple chronic conditions it would be in the best interest of the patient for me to monitor, assess and evaluate those as well.     Last seen a year ago for a comprehensive exam/Medicare AWV.    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  2 grandkids,, here in [facility].  Other grandkids were in [place] but moved to [place] in 2021.  Elderly mother is in [place], 2 sisters live close to mother.     09/2021: Dexa showed osteopenia with high FRAX risk, but I felt risk was overestimated due to FH and I did not feel strongly about starting medication.    She has had a good year overall.    Continues to exercise regularly-not playing as much soccer now, but doing water aerobics, yoga twice a week, and just started dancing 2 nights a week which she has enjoyed.  She has occasional dyspnea on exertion, but thinks due to  deconditioning.    Just returned from a trip to [place] and [place] with her soccer friends and really enjoyed this.    Feels well overall.    Discussed vaccinations and recommended the RSV vaccine.    Blood pressure initially a little high, on repeat is improved but still a little high for her liking.  She will monitor at home.    H/o hyperlipidemia, treated with Atorvastatin.  Cardiac calcium score zero in 08/2019.     H/o osteopenia; had left wrist fracture 2017  Taking Caltrate.  Dexa 2017 - osteopenia.  Dexa 08/2019 - osteopenia.  FRAX hip 2.3%, risk of any fracture 16.5%.  Dexa 09/2021: osteopenia.  FRAX 4.4% risk for hip fracture, 26.3% risk for any fracture - I felt risk was overestimated and did not feel she needed to start medication.     Has had vertigo off and on for years, often very severe.    In 2021 she noted that she recently she has noticed some mild very short limited vertigo when she turns over in bed to her left, lasts for about 15 seconds, then resolves.  She does not have any when she turns to her right.     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  She has tried various medicines for this but it has not helped, so she does not take any medication regularly.     Saw [facility] in 2018 for hoarseness.  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  Did vocal exercises for a year - no help, so she stopped them     Has mild depression, controlled with Sertraline.       Has FH breast ca; she is up to date on Mammogram.     H/o abnl Pap in past but had hysterectomy in 2019.     Health maint items reviewed and discussed:  -Vaccines    Past medical history, medications, medication allergies, social and family history are all reviewed.    Patient Active Problem List    Diagnosis Date Noted   ? Dysphonia 07/25/2017   ? 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 Osteopenia 12/30/2015   ? Family history of breast cancer in sister 07/21/2015   ? Family history of ovarian cancer 07/21/2015   ? Hyperlipidemia, unspecified    ? Mild depression      MED LIST:  Outpatient Encounter Medications as of 08/30/2022   Medication Sig Dispense Refill   ? atorvastatin (LIPITOR) 20 mg tablet TAKE 1 TABLET BY MOUTH EVERY DAY 90 tablet 3   ? Biotin 1 mg tab Take  by mouth daily.     ? [DISCONTINUED] DOCOSAHEXANOIC ACID/EPA (FISH OIL PO) Take  by mouth daily.     ? ERGOCALCIFEROL (VITAMIN D2) (VITAMIN D PO) Take 2,000 Units by mouth daily.     ? GLUCOSAMINE HCL/CHONDR SU A NA (OSTEO BI-FLEX PO) Take 1 mg by mouth daily.     ? sertraline (ZOLOFT) 50 mg tablet TAKE 1 TABLET BY MOUTH EVERY DAY 90 tablet 3     No facility-administered encounter medications on file as of 08/30/2022.     ROS:  Constitutional: energy level low.  Weight stable. No fever.  No chills.  Eyes:  No vision problems, no drainage.  Ears/Nose/Mouth/Throat:  No hearing difficulties.  No congestion.  No oral ulcers.  No sore throat.  No  difficulty swallowing.  CV: No chest pain or palpitations.  Resp: No shortness of breath or cough.  GI: No abdominal pain.  Bowels normal, with no hematochezia or melena.  GU: no dysuria.  No urinary frequency.  No significant incontinence.  MS:  No significant joint or muscle pain.  SKIN:  Has noted no new moles or changes in moles.  BREASTS:  Completes breast self-exam regularly and has not noticed any changes.  NEURO:  No tingling, numbness, weakness, or other complaints.  PSYCH: Mood good, denies depression or anxiety.  Sleeps well.  ENDO: no vaginal bleeding.  No vaginal discharge.    Objective  Vitals:    08/30/22 1000 08/30/22 1002   BP: (!) 140/77 133/77   BP Source: Arm, Right Upper Arm, Left Upper   Pulse: 71 75   Temp: 36.8 ?C (98.3 ?F)    Resp: 18    SpO2: 95%    TempSrc: Temporal    PainSc: Zero    Weight: 56.2 kg (124 lb)    Height: 152.4 cm (5')      Body mass index is 24.22 kg/m?Marland Kitchen    Constitutional: Alert, well nourished, in no distress.    Eyes:  PERRLA, EOMI.  Conjunctiva are non-icteric and are no injected.  ENT: External ear canals are negative.  TM's are clear bilat, with no erythema.     ENDO:  No thyroid enlargement or nodules.  BREASTS:  Mild to moderate fibrocystic changes bilat, with no dominant mass or nodule.  No skin abnormality.  No axillary adenopathy.  No nipple discharge.  CV: Heart exam shows regular rhythm, no murmur, no S3, no S4.  Carotid pulses are 2/4 and equal bilat, with no carotid bruits.    RESP: Lungs are clear to auscultation bilat, with no rales, rhonchi, or wheezing.  GI: Normal bowel sounds.  Abdomen is soft, nontender.  No hepatosplenomegaly, no mass.   MS: no joint swelling or erythema.  SKIN:  No rash, no abnormal appearing moles.  NEURO:  CN's 2-12 intact; strength is equal bilat.  PSYCH:  Good eye contact, normal affect.  LYMPH:  No significant LAD in neck, axillary or groin areas.    ASSESSMENT/PLAN:  Rebekah Hill was seen today for annual wellness visit and cholesterol.    Diagnoses and all orders for this visit:    Medicare annual wellness visit, subsequent    Hyperlipidemia, unspecified hyperlipidemia type  -     CBC AND DIFF; Future  -     COMPREHENSIVE METABOLIC PANEL; Future  -     LIPID PROFILE; Future    Fatigue, unspecified type  -     THYROID STIMULATING HORMONE-TSH; Future    Osteopenia of multiple sites    Mild depression    Other orders  -     FLU VACCINE =>65 YO HIGH DOSE QUAD (PF) (FLUZONE)  -     COVID-19 VACCINE (>=12YO) (MODERNA) (2023 -2024 FORMULA) MRNA PF  -     sertraline (ZOLOFT) 50 mg tablet; Take one tablet by mouth daily.  -     atorvastatin (LIPITOR) 20 mg tablet; Take one tablet by mouth daily.      Lab work today.  Flu shot and COVID booster given today.  Recommended RSV vaccine-she will get at the pharmacy.  Refilled meds.  Repeat DEXA next year.  Discussed and encouraged breast self-exam monthly to screen for breast cancer.  Discussed and encouraged a regular exercise program and a healthy diet.  FOLLOW-UP:  1 year for wellness visit/comprehensive exam, or sooner if problems.

## 2022-08-31 ENCOUNTER — Encounter: Admit: 2022-08-31 | Discharge: 2022-08-31 | Payer: MEDICARE

## 2022-08-31 DIAGNOSIS — R5383 Other fatigue: Secondary | ICD-10-CM

## 2022-08-31 DIAGNOSIS — E785 Hyperlipidemia, unspecified: Secondary | ICD-10-CM

## 2022-11-10 ENCOUNTER — Encounter: Admit: 2022-11-10 | Discharge: 2022-11-10 | Payer: MEDICARE

## 2022-11-10 ENCOUNTER — Ambulatory Visit: Admit: 2022-11-10 | Discharge: 2022-11-10 | Payer: MEDICARE

## 2022-11-10 DIAGNOSIS — E785 Hyperlipidemia, unspecified: Secondary | ICD-10-CM

## 2022-11-10 DIAGNOSIS — F32A Depression: Secondary | ICD-10-CM

## 2022-11-10 DIAGNOSIS — Z1231 Encounter for screening mammogram for malignant neoplasm of breast: Secondary | ICD-10-CM

## 2022-11-10 DIAGNOSIS — Z87898 Personal history of other specified conditions: Secondary | ICD-10-CM

## 2022-11-10 DIAGNOSIS — R112 Nausea with vomiting, unspecified: Secondary | ICD-10-CM

## 2022-11-10 DIAGNOSIS — Z1371 Encounter for nonprocreative screening for genetic disease carrier status: Secondary | ICD-10-CM

## 2022-11-10 NOTE — Progress Notes
Results noted; benign.

## 2023-08-30 ENCOUNTER — Encounter: Admit: 2023-08-30 | Discharge: 2023-08-30 | Payer: MEDICARE

## 2023-08-30 NOTE — Progress Notes
She presents today for an [employer] [facility] visit.  Chief Complaint   Patient presents with    Annual Wellness Visit       Past Medical History:   Diagnosis Date    BRCA1 negative     Depression     History of abnormal Pap smear     Other and unspecified hyperlipidemia     Post-operative nausea and vomiting      Surgical History:   Procedure Laterality Date    HX BREAST BIOPSY Right 1977    Benign excisional    TAH AND BSO  10/25/2015    COLONOSCOPY N/A 05/30/2016    Performed by Virgina Organ, MD at [facility] ENDO/GI    BELPHAROPTOSIS REPAIR      LUMPECTOMY      TONSILLECTOMY      VAGINAL DILATATION       Family History   Problem Relation Name Age of Onset    Cancer-Breast Sister  19    Cancer-Ovarian Paternal Aunt  90    Cancer-Breast Paternal Aunt  40     Social History     Socioeconomic History    Marital status: Divorced    Number of children: 2   Occupational History    Occupation: Bank VP/construction lending   Tobacco Use    Smoking status: Never    Smokeless tobacco: Never   Substance and Sexual Activity    Alcohol use: Yes     Alcohol/week: 0.0 standard drinks of alcohol     Comment: <1 month   Social History Narrative    THIS ENTRY IS PRELIMINARY DOCUMENTATION.                    A Health Risk assessment was performed by the patient today & reviewed with the patient.  Health Risk Assessment Questionnaire  Current Care  List of Providers you have seen in the last two years: Dr lehr;Eye Dr;Dentist  Are you receiving home health?: No  During the past 4 weeks, how would you rate your health in general?: Very Good    Outside Care  Since your last PCP visit, have you received care outside of [facility]?: No  What type of care did you receive outside of [facility]? (select all that apply): (not recorded)  What is the Facility where you received care and the provider's name?: (not recorded)    Physical Activity  Do you exercise or are you physically active?: Yes  How many days a week do you usually exercise or are physically active?: 5  On days when you exercised or were physically active, how many minutes was the activity?: 60  During the past four weeks, what was the hardest physical activity you could do for at least two minutes?: Heavy    Diet  In the past month, were you worried whether your food would run out before you or your family had money to buy more?: No  In the past 7 days, how many times did you eat fast food or junk food or pizza?: (!) 2  In the past 7 days, how many servings of fruits or vegetables did you eat each day?: (!) 2-3  In the past 7 days, how many sodas and sugar sweetened drinks (regular, not diet) did you drink each day?: 0    Smoke/Tobacco Use  Are you currently a smoker?: No  Do you use any of the following (select  all that apply)?: (not recorded)    Alcohol Use  Do you drink alcohol?: Yes  Are you Female or Female?: Female  Female: In the last three months, have you had >3 alcoholic beverages in any one day or >14 in any one week?: (not recorded)  Female: In the last three months, have you had >3 alcoholic beverages in any one day or >7 in any one week?: No    Depression Screen  Little interest or pleasure in doing things: Not at all  Feeling down, depressed or hopeless: Not at all    Pain  How would you rate your pain today?: Very mild pain    Ambulation  Do you use any assistive devices for ambulation?: No  What types of device? (select all that apply): (not recorded)    Fall Risk  Does it take you longer than 30 seconds to get up and out of a chair?: No  Have you fallen in the past year?: (!) Yes  Fall History (last 67mo): (!) One Fall without Major Injury    Motor Vehicle Safety  Do you fasten your seat belt when you are in the car?: Yes    Sun Exposure  Do you protect yourself from the sun? For example, wear sunscreen when outside.: Yes    Hearing Loss  Do you have trouble hearing the television or radio when others do not?: (!) Yes  Do you have to strain or struggle to hear/understand conversation?: (!) Yes  Do you use hearing aids?: (!) Yes    Urinary Incontinence  Have you had urine leakage in the past 6 months?: (!) Yes  Does your urine leakage negatively impact your daily activities or sleep?: No      Cognitive Impairment  During the past 12 months, have you experienced confusion or memory loss that is happening more often or is getting worse?: No    Functional Screen  Do you live alone?: Yes  Do you live at: Home  Can you drive your own car or travel alone by bus or taxi?: Yes  Can you shop for groceries or clothes without help?: Yes  Can you prepare your own meals?: Yes  Can you do your own housework without help?: Yes  Can you handle your own money without help?: Yes  Do you need help eating, bathing, dressing, or getting around your home?: No  Do you feel safe?: Yes  Does anyone at home hurt you, hit you, or threaten you?: No  Have you ever been the victim of abuse?: No    Home Safety  Does your home have grab bars in the bathroom?: (!) No  Does your home have hand rails on stairs and steps?: Yes  Does your home have functioning smoke alarms?: Yes    Advance Directive  Do you have a living will or Advance Directive?: Yes  Are you interested in discussing the importance of a living will or Advance Directive?: (not recorded)    Dental Screen  Have you had an exam by your dentist in the last year?: Yes    Vision Screen  Do you have diabetes?: No  When was your last eye exam?: (not recorded)  Eye Doctor Name?: (not recorded)  Eye Doctor Facility?: (not recorded)    She denies trouble with urinary incontinence.      Personal prevention Plan reviewed with patient and a copy was given via the After Visit Summary.    While the patient was here today, due  to his/her multiple chronic conditions it would be in the best interest of the patient for me to monitor, assess and evaluate those as well. They are as follows.  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    Laryngopharyngeal reflux 07/25/2017    Osteopenia 12/30/2015    Family history of breast cancer in sister 07/21/2015    Family history of ovarian cancer 07/21/2015    Hyperlipidemia, unspecified     Mild depression        Also, she has additional complaints of:   Right knee OA- request for PT    Records requested at the time of this visit:  N/A    Prior consultations reviewed at the time of this visit: N/A      BMI:  Body mass index is 24.41 kg/m?Marland Kitchen  No data recorded    Wt Readings from Last 6 Encounters:   08/31/23 56.7 kg (125 lb)   08/30/22 56.2 kg (124 lb)   08/27/21 56.8 kg (125 lb 3.2 oz)   08/21/20 58.2 kg (128 lb 3.2 oz)   08/19/19 58 kg (127 lb 12.8 oz)   01/23/18 54.4 kg (120 lb)       Chief Complaint   Patient presents with    Annual Wellness Visit     Rebekah Hill is here for her Medicare annual wellness visit and routine physical exam.    Last seen a year ago for a comprehensive exam/Medicare AWV.     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  2 grandkids, here in [facility].  Other grandkids were in [place] but moved to [place] in 2021.  Elderly mother is in [place], 2 sisters live close to mother. Mom just turned 90! They are encouraging her to move to independent living for better eating and better socialization. She has been very depressed since her husband passed 2 years ago. They had been together since her mom was 17.       She has had a good year overall.     Continues to exercise regularly- she has retired from soccer. She is now playing pickle ball and doing water aerobics, yoga twice a week, and dancing 2 nights a week which she has enjoyed.      Has right knee OA which is mostly manageable but she is wanting to try PT to see if this helps before seeing ortho.  A friend of hers made a recommendation for PT which she cannot remember off the top of her head but I will give her an order and she can take it with her.     Discussed vaccinations and recommended the RSV vaccine.     Blood pressure initially a little high, on repeat is improved but still a little high for her liking. Home BP often 120/70's. She'll continue to keep an eye on this.      H/o hyperlipidemia, treated with Atorvastatin.  Cardiac calcium score zero in 08/2019.  08/2023: Recently started taking Co Q 10. Denies myalgia or myopathy.      H/o osteopenia; had left wrist fracture 2017  Taking Caltrate.  Dexa 2017 - osteopenia.  Dexa 08/2019 - osteopenia.  FRAX hip 2.3%, risk of any fracture 16.5%.  Dexa 09/2021: osteopenia.  FRAX  4.4% risk for hip fracture, 26.3% risk for any fracture - I felt risk was overestimated and did not feel she needed to start medication.  08/2023: Due for Dexa- will do this at [facility] in [place]. Taking calcium chew since oral calcium pills were hard to swallow.      Has had vertigo off and on for years, often very severe.    In 2021 she noted that she recently she has noticed some mild very short limited vertigo when she turns over in bed to her left, lasts for about 15 seconds, then resolves.  She does not have any when she turns to her right.     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 After much reflection she believes symptoms occur mostly in the fall. She has tried various medicines for this but it has not helped, so she does not take any medication regularly.     Saw [facility] in 2018 for hoarseness.  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  Did vocal exercises for a year - no help, so she stopped them     Has mild depression, controlled with Sertraline.       Has FH breast ca; she is up to date on Mammogram.     H/o abnl Pap in past but had hysterectomy in 2019.     Health maint items reviewed and discussed:  -Flu shot- today  -Covid booster- today.   -RSV at outpatient pharmacy.   -Dexa      Past medical history, medications, medication allergies, social and family history are all reviewed.    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 90 tablet 3    [DISCONTINUED] atorvastatin (LIPITOR) 20 mg tablet Take one tablet by mouth daily. 90 tablet 3    [DISCONTINUED] Biotin 1 mg tab Take  by mouth daily.      Ca-D3-mag-zinc-cop-mang-boron (CALTRATE 600-D PLUS MINERALS) 600 mg calcium- 800 unit-40 mg chew Chew 1 each by mouth daily.      coenzyme Q10 100 mg tablet Take one tablet by mouth daily.      ERGOCALCIFEROL (VITAMIN D2) (VITAMIN D PO) Take 2,000 Units by mouth daily.      GLUCOSAMINE HCL/CHONDR SU A NA (OSTEO BI-FLEX PO) Take 1 mg by mouth daily.      sertraline (ZOLOFT) 50 mg tablet Take one tablet by mouth daily. 90 tablet 3    [DISCONTINUED] sertraline (ZOLOFT) 50 mg tablet Take one tablet by mouth daily. 90 tablet 3     No facility-administered encounter medications on file as of 08/31/2023.       ROS:  Constitutional: energy level  good.  Weight stable. No fever.  No chills.  Eyes:  No vision problems, no drainage.  Ears/Nose/Mouth/Throat:  + hearing  difficulties-wears hearing aids.  +Chronic sinus drainage and congestion.  No oral ulcers.  No sore throat.  No difficulty swallowing.  CV: No chest pain or palpitations.  Resp: No shortness of breath or cough.  GI: No abdominal pain.  Bowels normal, with no hematochezia or melena.  GU: no dysuria.  No urinary frequency.  No significant incontinence.  MS: + Right knee OA.  SKIN:  Has noted no new moles or changes in moles.  BREASTS:  Completes breast self-exam regularly and has not noticed any changes.  Mammogram due in January 2025.  NEURO:  No tingling, numbness, weakness, or other complaints.  PSYCH: Mood good, denies depression.  Stable treated anxiety.  Sleeps well.  ENDO: no vaginal bleeding.  No vaginal discharge.    Objective  Vitals:    08/31/23 1353 08/31/23 1354   BP: (!) 157/84 (!) 143/87   BP Source: Arm, Left Upper Arm, Left Upper   Pulse: 63    Temp: 36.7 ?C (98 ?F)    SpO2: 98%    TempSrc: Oral    Weight: 56.7 kg (125 lb)    Height: 152.4 cm (5')      Body mass index is 24.41 kg/m?Marland Kitchen    Constitutional: Alert, well nourished, in no distress.    Eyes:  PERRLA, EOMI.  Conjunctiva are non-icteric and are no injected.  ENT: External ear canals are negative.  TM's are clear bilat, with no erythema.     ENDO:  No thyroid enlargement or nodules.  BREASTS:  Mild to moderate fibrocystic changes bilat, with no dominant mass or nodule.  No skin abnormality.  No axillary adenopathy.  No nipple discharge.  CV: Heart exam shows regular rhythm, no murmur, no S3, no S4.  Carotid pulses are 2/4 and equal bilat, with no carotid bruits.    RESP: Lungs are clear to auscultation bilat, with no rales, rhonchi, or wheezing.  GI: Normal bowel sounds.  Abdomen is soft, nontender.  No hepatosplenomegaly, no mass.   MS: no joint swelling or erythema.  SKIN:  No rash, no abnormal appearing moles.  NEURO:  CN's 2-12 intact; strength is equal bilat.  PSYCH:  Good eye contact, normal affect.  LYMPH:  No significant LAD in neck or axillary areas.    ASSESSMENT/PLAN:  Shantavious was seen today for annual wellness visit.    Diagnoses and all orders for this visit:    Medicare annual wellness visit, subsequent    Hyperlipidemia, unspecified hyperlipidemia type  -     CBC AND DIFF; Future  -     COMPREHENSIVE METABOLIC PANEL; Future  -     LIPID PROFILE; Future  -     TSH WITH FREE T4 REFLEX; Future    Osteopenia of multiple sites  -     BONE DENSITY SPINE/HIP; Future    Mild depression    Estrogen deficiency    Dysphonia    Family history of ovarian cancer    Family history of breast cancer in sister    Laryngopharyngeal reflux    Encounter for screening mammogram for malignant neoplasm of breast  -     MAMMO SCREEN BILAT/TOMO; Future    Primary osteoarthritis of right knee  -     AMB REFERRAL TO PHYSICAL THERAPY    White coat syndrome with hypertension    Other orders  -     FLU VACCINE =>65 YO HIGH-DOSE TRIVALENT PF  -     COVID-19 VACCINE (>=12YO) (MODERNA) MRNA PF  -  Ca-D3-mag-zinc-cop-mang-boron (CALTRATE 600-D PLUS MINERALS) 600 mg calcium- 800 unit-40 mg chew; Chew 1 each by mouth daily.  -     coenzyme Q10 100 mg tablet; Take one tablet by mouth daily.  -     sertraline (ZOLOFT) 50 mg tablet; Take one tablet by mouth daily.  -     atorvastatin (LIPITOR) 20 mg tablet; Take one tablet by mouth daily.          Reviewed Medicare HRA and provided Medicare health maintenance plan.  Fasting labs today.  Meds refilled.  Flu and COVID shot today.  Provided Rx for physical therapy to address her right knee OA.  Discussed and encouraged breast self-exam monthly to screen for breast cancer.  Discussed and encouraged a regular exercise program and a healthy diet.  Mammogram due in January.  Orders placed.  She will do this at [facility].  DEXA ordered-  Diagnostic imaging.  Colon cancer screening due in 2027.    FOLLOW-UP:  1 year for wellness visit/comprehensive exam, or sooner if problems.

## 2023-08-31 ENCOUNTER — Encounter: Admit: 2023-08-31 | Discharge: 2023-08-31 | Payer: MEDICARE

## 2023-08-31 ENCOUNTER — Ambulatory Visit: Admit: 2023-08-31 | Discharge: 2023-08-31 | Payer: MEDICARE

## 2023-08-31 DIAGNOSIS — M1711 Unilateral primary osteoarthritis, right knee: Secondary | ICD-10-CM

## 2023-08-31 DIAGNOSIS — F32A Mild depression: Secondary | ICD-10-CM

## 2023-08-31 DIAGNOSIS — K219 Gastro-esophageal reflux disease without esophagitis: Secondary | ICD-10-CM

## 2023-08-31 DIAGNOSIS — Z8041 Family history of malignant neoplasm of ovary: Secondary | ICD-10-CM

## 2023-08-31 DIAGNOSIS — I10 Essential (primary) hypertension: Secondary | ICD-10-CM

## 2023-08-31 DIAGNOSIS — Z0000 Encounter for general adult medical examination without abnormal findings: Secondary | ICD-10-CM

## 2023-08-31 DIAGNOSIS — E2839 Other primary ovarian failure: Secondary | ICD-10-CM

## 2023-08-31 DIAGNOSIS — R490 Dysphonia: Secondary | ICD-10-CM

## 2023-08-31 DIAGNOSIS — Z1231 Encounter for screening mammogram for malignant neoplasm of breast: Secondary | ICD-10-CM

## 2023-08-31 DIAGNOSIS — Z803 Family history of malignant neoplasm of breast: Secondary | ICD-10-CM

## 2023-08-31 DIAGNOSIS — M8589 Other specified disorders of bone density and structure, multiple sites: Secondary | ICD-10-CM

## 2023-08-31 MED ORDER — ATORVASTATIN 20 MG PO TAB
20 mg | ORAL_TABLET | Freq: Every day | ORAL | 3 refills | Status: AC
Start: 2023-08-31 — End: ?

## 2023-08-31 MED ORDER — SERTRALINE 50 MG PO TAB
50 mg | ORAL_TABLET | Freq: Every day | ORAL | 3 refills | Status: AC
Start: 2023-08-31 — End: ?

## 2023-09-01 DIAGNOSIS — E785 Hyperlipidemia, unspecified: Secondary | ICD-10-CM

## 2023-09-01 LAB — CBC AND DIFF
ABSOLUTE BASO COUNT: 0 10*3/uL (ref 0–0.20)
ABSOLUTE EOS COUNT: 0 10*3/uL (ref 0–0.45)
ABSOLUTE LYMPH COUNT: 1.7 10*3/uL (ref 1.0–4.8)
ABSOLUTE MONO COUNT: 0.5 10*3/uL (ref 0–0.80)
ABSOLUTE NEUTROPHIL: 3.4 10*3/uL (ref 1.8–7.0)
HEMATOCRIT: 42 % (ref 36–45)
MCH: 29 pg (ref 26–34)
MCHC: 34 g/dL (ref 32.0–36.0)
MCV: 87 FL — ABNORMAL HIGH (ref 80–100)
RBC COUNT: 4.8 M/UL (ref <100)
WBC COUNT: 5.9 10*3/uL (ref >40)

## 2023-09-01 LAB — COMPREHENSIVE METABOLIC PANEL
ALBUMIN: 4.3 g/dL (ref 3.5–5.0)
ALK PHOSPHATASE: 85 U/L (ref 25–110)
ALT: 26 U/L (ref 7–56)
ANION GAP: 8 % (ref 3–12)
AST: 27 U/L (ref 7–40)
CO2: 29 MMOL/L (ref 21–30)
EGFR: 58 mL/min — ABNORMAL LOW (ref >60)
TOTAL BILIRUBIN: 0.8 mg/dL (ref 0.2–1.3)

## 2023-09-01 LAB — LIPID PROFILE
CHOLESTEROL: 170 mg/dL (ref <200)
TRIGLYCERIDES: 116 mg/dL (ref <150)
VLDL: 23 mg/dL (ref 70–100)

## 2023-09-01 LAB — TSH WITH FREE T4 REFLEX: TSH: 1 uU/mL (ref 0.35–5.00)

## 2023-10-10 ENCOUNTER — Encounter: Admit: 2023-10-10 | Discharge: 2023-10-10 | Payer: MEDICARE

## 2023-10-10 DIAGNOSIS — Z9289 Personal history of other medical treatment: Secondary | ICD-10-CM

## 2023-10-30 NOTE — Progress Notes
Date of Service: 11/03/2023    Rebekah Hill is a 73 y.o. female.  DOB: June 07, 1951  MRN: [id number]     Subjective:             History of Present Illness  Chief Complaint   Patient presents with    Osteopenia     Rebekah Hill is here today to discuss osteoporosis treatment options.      Bone density in Dec 2024 showed osteopenia in the hips. FRAX score is 8.5% risk of hip fracture and 28.7% of any major osteoporotic fracture in the next 10 years. Exceeding threshold of 3% and 20% for treatment.     Levels have worsened since last scan. Dr. Johny Shears felt her Rebekah Hill was perhaps falsely elevated before and they discussed this and held off on Rx treatment. Given worsening since last scan, I would recommend treatment. She does have history of left wrist fracture x 2, right wrist fracture x 1 and an ankle fracture.  All these injuries were soccer related.  She does have some episodic GERD generally triggered by eating certain foods or eating late at night and lying down.  She takes Tums as needed but does not take an H2 blocker or PPI regularly.  We discussed treatment options including bisphosphonate therapy, Reclast infusions and Prolia.  After discussion of her treatment options we decided that Fosamax was likely our best first course treatment for her.  She regularly engages in activities such as pickleball, skiing, soccer etc.  She lives a very active lifestyle.  She did have a recent fall with minor injury.  She typically does not have an issue with falling however she engages in activities that would certainly would put her at risk for fall and injury.  She is taking calcium, vitamin D and eats a balanced diet.     She is getting ready to travel to [place] for months to go skiing.  she will be home for 5 days and then get to travel with her grandson to [place] for his spring break.  They are going to be gone for 10 days and she could be more excited!      HM:  -Mammogram is scheduled later this month.  -Rest is up-to-date    Reviewed chronic conditions and current medications.        Constitutional: energy level good.  Weight stable. No fever.  No chills.  Eyes:  No vision problems, no drainage.  Ears/Nose/Mouth/Throat:  No hearing difficulties.  No congestion.  No oral ulcers.  No sore throat.  No difficulty swallowing.  CV: No chest pain or palpitations.  Resp: No shortness of breath or cough.  GI: No abdominal pain.  Bowels normal, with no hematochezia or melena.  GU: no dysuria.  No urinary frequency.  No significant incontinence.  MS:  No significant joint or muscle pain.  History of fractures.  SKIN:  Has noted no new moles or changes in moles.  NEURO:  No tingling, numbness, weakness, or other complaints.  PSYCH: Mood good, denies depression or anxiety.  Sleeps well.            Objective:          alendronate (FOSAMAX) 70 mg tablet Take one tablet by mouth every 7 days. Take at least 30 minutes before breakfast with plain water. Do not lie down for 30 minutes.    atorvastatin (LIPITOR) 20 mg tablet Take one tablet by mouth daily.    Ca-D3-mag-zinc-cop-mang-boron (CALTRATE 600-D PLUS  MINERALS) 600 mg calcium- 800 unit-40 mg chew Chew 1 each by mouth daily.    coenzyme Q10 100 mg tablet Take one tablet by mouth daily.    ERGOCALCIFEROL (VITAMIN D2) (VITAMIN D PO) Take 2,000 Units by mouth daily.    GLUCOSAMINE HCL/CHONDR SU A NA (OSTEO BI-FLEX PO) Take 1 mg by mouth daily.    sertraline (ZOLOFT) 50 mg tablet Take one tablet by mouth daily.     Vitals:    11/03/23 1104   BP: 138/72   BP Source: Arm, Left Upper   Pulse: 66   Temp: 36.6 ?C (97.9 ?F)   Resp: 14   SpO2: 98%   TempSrc: Temporal   PainSc: Zero   Weight: 58.1 kg (128 lb)   Height: 152.4 cm (5')     Body mass index is 25 kg/m?Marland Kitchen   Constitutional: Alert, no distress.    Eyes: Conjunctiva are non-icteric and are no injected.  ENT: Normocephalic, atraumatic.  CV: Heart exam shows regular rhythm, no murmur, no S3, no S4.    RESP: Lungs are clear to auscultation bilat, with no rales, rhonchi, or wheezing.  MS: no joint swelling or erythema.  SKIN:  No rash.  NEURO:  CN's 2-12 grossly intact.  PSYCH:  Good eye contact, normal affect.         Assessment and Plan:  [name] was seen today for osteopenia.    Diagnoses and all orders for this visit:    Osteopenia of multiple sites    Other orders  -     alendronate (FOSAMAX) 70 mg tablet; Take one tablet by mouth every 7 days. Take at least 30 minutes before breakfast with plain water. Do not lie down for 30 minutes.        Osteopenia-given worsening of her bone density in her spine and left femoral neck I would recommend starting treatment.  We discussed the options and I feel Fosamax would be her best first-line treatment option.  She is open to trying this.  If it does exacerbate her GERD we can look into other options such as a Reclast infusion versus Prolia.  She has had numerous fractures as mentioned.  She is taking calcium and vitamin D and regularly engaging in weightbearing exercise.  Would plan to repeat a DEXA scan 1 year following treatment.  We discussed how to take Fosamax properly to limit acid reflux issues.  She will follow-up if she is not tolerating this for any reason.    Follow up with PCP as previously scheduled or sooner if needed.     Total Time Today was 30 minutes in the following activities: Preparing to see the patient, Performing a medically appropriate examination and/or evaluation, Counseling and educating the patient/family/caregiver and Documenting clinical information in the electronic or other health record.

## 2023-11-03 ENCOUNTER — Ambulatory Visit: Admit: 2023-11-03 | Discharge: 2023-11-03 | Payer: MEDICARE

## 2023-11-03 ENCOUNTER — Encounter: Admit: 2023-11-03 | Discharge: 2023-11-03 | Payer: MEDICARE

## 2023-11-03 DIAGNOSIS — M8589 Other specified disorders of bone density and structure, multiple sites: Secondary | ICD-10-CM

## 2023-11-03 MED ORDER — ALENDRONATE 70 MG PO TAB
70 mg | ORAL_TABLET | ORAL | 3 refills | Status: AC
Start: 2023-11-03 — End: ?

## 2023-11-30 ENCOUNTER — Encounter: Admit: 2023-11-30 | Discharge: 2023-11-30 | Payer: MEDICARE

## 2023-12-11 ENCOUNTER — Encounter: Admit: 2023-12-11 | Discharge: 2023-12-11 | Payer: MEDICARE

## 2023-12-11 ENCOUNTER — Ambulatory Visit: Admit: 2023-12-11 | Discharge: 2023-12-12 | Payer: MEDICARE

## 2024-01-25 ENCOUNTER — Encounter: Admit: 2024-01-25 | Discharge: 2024-01-25

## 2024-03-06 ENCOUNTER — Encounter: Admit: 2024-03-06 | Discharge: 2024-03-06 | Payer: MEDICARE

## 2024-04-01 ENCOUNTER — Encounter: Admit: 2024-04-01 | Discharge: 2024-04-01 | Payer: MEDICARE

## 2024-04-02 ENCOUNTER — Encounter: Admit: 2024-04-02 | Discharge: 2024-04-02 | Payer: MEDICARE

## 2024-04-08 ENCOUNTER — Ambulatory Visit: Admit: 2024-04-08 | Discharge: 2024-04-08 | Payer: MEDICARE

## 2024-04-08 ENCOUNTER — Encounter: Admit: 2024-04-08 | Discharge: 2024-04-08 | Payer: MEDICARE

## 2024-04-10 ENCOUNTER — Encounter: Admit: 2024-04-10 | Discharge: 2024-04-10 | Payer: MEDICARE

## 2024-05-22 ENCOUNTER — Ambulatory Visit: Admit: 2024-05-22 | Discharge: 2024-05-22 | Payer: MEDICARE

## 2024-05-22 ENCOUNTER — Encounter: Admit: 2024-05-22 | Discharge: 2024-05-22 | Payer: MEDICARE

## 2024-05-22 MED ORDER — ZOLEDRONIC ACID-MANNITOL-WATER 5 MG/100 ML IV PGBK
5 mg | Freq: Once | INTRAVENOUS | 0 refills | Status: CP
Start: 2024-05-22 — End: ?
  Administered 2024-05-22: 21:00:00 5 mg via INTRAVENOUS

## 2024-05-22 MED ORDER — ZOLEDRONIC ACID-MANNITOL-WATER 5 MG/100 ML IV PGBK
5 mg | Freq: Once | INTRAVENOUS | 0 refills | Status: CN
Start: 2024-05-22 — End: ?

## 2024-05-22 NOTE — Progress Notes
 1600: pt in clinic for reclast  infusion; pt tolerated infusion without incident.  VSS and steady gait witnessed, pt endorses taking vitamin D and calcium supplements.  Pt provided printed AVS.  Vitals:    05/22/24 1513 05/22/24 1551   BP: (!) 153/83 (!) 150/78   BP Source: Arm, Left Upper Arm, Left Upper   Pulse: 58 57   Temp: 36.5 ?C (97.7 ?F) 36.7 ?C (98.1 ?F)   Resp: 18    SpO2: 99% 99%   O2 Device: None (Room air)    TempSrc: Temporal    Weight: 56.7 kg (125 lb)

## 2024-05-23 DIAGNOSIS — M810 Age-related osteoporosis without current pathological fracture: Principal | ICD-10-CM

## 2024-06-08 ENCOUNTER — Encounter: Admit: 2024-06-08 | Discharge: 2024-06-08 | Payer: MEDICARE

## 2024-08-29 ENCOUNTER — Encounter: Admit: 2024-08-29 | Discharge: 2024-08-29 | Payer: MEDICARE

## 2024-08-29 NOTE — Progress Notes [1]
 She presents today for an [employer] [facility] visit.  Chief Complaint   Patient presents with    Annual Wellness Visit     Bp has been high  Diarrhea if she eats beef or protein bars   Wants referral to [facility] [employer] for her Vertigo            Past Medical History:    BRCA1 negative    Cataract    Depression    History of abnormal Pap smear    Joint pain    Low vitamin D level    Osteoarthritis    Osteoporosis    Other and unspecified hyperlipidemia    Post-operative nausea and vomiting     Surgical History:   Procedure Laterality Date    HX BREAST BIOPSY Right 1977    Benign excisional    TAH AND BSO  10/25/2015    COLONOSCOPY N/A 05/30/2016    Performed by Priscella Squires, MD at [facility] ENDO/GI    BELPHAROPTOSIS REPAIR      BILATERAL SALPINGO-OOPHORECTOMY  2018    HX TONSILLECTOMY  1958    LUMPECTOMY      TONSILLECTOMY      VAGINAL DILATATION       Family History   Problem Relation Name Age of Onset    Cancer-Breast Sister  25    Cancer-Ovarian Paternal Aunt  44    Cancer-Breast Paternal Aunt  64    Back pain Mother Roseline Chyle         Back Surgery    Back pain Brother Francis Chyle         Lower back degenerative    Cancer Sister Marval Boys         Breast Cancer    Heart problem Father Carlin Chyle         Quadruple by pass 2x, heart attacks and stents    High Cholesterol Brother Franky Chyle         54    Anemia Sister Doyal Bury         30?s    Hypertension Sister Doyal Bury         65    High Cholesterol Sister Doyal Bury         60    Osteoporosis Sister Doyal Bury         Osteopenia 75    Anemia Mother Roseline Chyle         20?s    Osteoporosis Mother Roseline Chyle         86    Cancer Brother Francis Chyle         30    Arthritis-osteo Sister Debbie [place]         31    Anemia Sister Debbie Givens         20?s    Thyroid Disease Sister Marval Boys         28    Osteoporosis Sister Marval Boys         Osteopenia  73    Arthritis-osteo Father Carlin Chyle Hypertension Father Carlin Chyle     High Cholesterol Father Carlin Chyle         42    Heart Disease Father Carlin Chyle         42    Hip Fracture Father Carlin Chyle         16     Social History[1]     A Health Risk assessment  was performed by the patient today & reviewed with the patient.  Health Risk Assessment Questionnaire  Current Care  List of Providers you have seen in the last two years: Dr hoy;Dentist;Dr Rasmussen;[place] endocrinologist  Are you receiving home health?: No  During the past 4 weeks, how would you rate your health in general?: Excellent    Outside Care  Since your last PCP visit, have you received care outside of [facility]?: (!) Yes  What type of care did you receive outside of [facility]? (select all that apply): (!) Specialty Visit  What is the Facility where you received care and the provider's name?: Dr lise    Physical Activity  Do you exercise or are you physically active?: Yes  How many days a week do you usually exercise or are physically active?: 4  On days when you exercised or were physically active, how many minutes was the activity?: 2.5  During the past four weeks, what was the hardest physical activity you could do for at least two minutes?: Heavy    Diet  In the past month, were you worried whether your food would run out before you or your family had money to buy more?: No  In the past 7 days, how many times did you eat fast food or junk food or pizza?: 1  In the past 7 days, how many servings of fruits or vegetables did you eat each day?: (!) 0-1  In the past 7 days, how many sodas and sugar sweetened drinks (regular, not diet) did you drink each day?: 0    Smoke/Tobacco Use  Are you currently a smoker?: No       Alcohol Use  Do you drink alcohol?: Yes  Are you Female or Female?: Female     Female: In the last three months, have you had >3 alcoholic beverages in any one day or >7 in any one week?: No    Depression Screen  Little interest or pleasure in doing things: Not at all  Feeling down, depressed or hopeless: Not at all    Pain  How would you rate your pain today?: (!) Moderate pain    Ambulation  Do you use any assistive devices for ambulation?: No       Fall Risk  Does it take you longer than 30 seconds to get up and out of a chair?: No  Have you fallen in the past year?: (!) Yes  Fall History (last 47mo): (!) Two or More Falls    Motor Vehicle Safety  Do you fasten your seat belt when you are in the car?: Yes    Sun Exposure  Do you protect yourself from the sun? For example, wear sunscreen when outside.: Yes    Hearing Loss  Do you have trouble hearing the television or radio when others do not?: (!) Yes  Do you have to strain or struggle to hear/understand conversation?: (!) Yes  Do you use hearing aids?: (!) Yes    Urinary Incontinence  Have you had urine leakage in the past 6 months?: (!) Yes  Does your urine leakage negatively impact your daily activities or sleep?: No      Cognitive Impairment  During the past 12 months, have you experienced confusion or memory loss that is happening more often or is getting worse?: No    Functional Screen  Do you live alone?: Yes  Do you live at: Home  Can you drive your own car or travel alone by bus or taxi?: Yes  Can you shop for groceries or clothes without help?: Yes  Can you prepare your own meals?: Yes  Can you do your own housework (such as laundry or other household tasks) without help?: Yes  Can you handle your own money without help?: Yes  Do you need help eating, bathing, dressing, or getting around your home?: No  Do you feel safe?: Yes  Does anyone at home hurt you, hit you, or threaten you?: No  Have you ever been the victim of abuse?: No    Home Safety  Does your home have grab bars in the bathroom?: (!) No  Does your home have hand rails on stairs and steps?: Yes  Does your home have functioning smoke alarms?: Yes    Advance Directive  Do you have a living will or Advance Directive?: Yes       Dental Screen  Have you had an exam by your dentist in the last year?: Yes    Vision Screen  Do you have diabetes?: No  When was your last eye exam?: 07/12/24  Eye Doctor Name?: Dr Dorthy  Eye Doctor Facility?: [facility]    She denies trouble with urinary incontinence.      Personal prevention Plan reviewed with patient and a copy was given via the After Visit Summary.    While the patient was here today, due to his/her multiple chronic conditions it would be in the best interest of the patient for me to monitor, assess and evaluate those as well. They are as follows.  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      BMI:  Body mass index is 24.96 kg/m?SABRA  No data recorded  Wt Readings from Last 6 Encounters:   08/30/24 58 kg (127 lb 12.8 oz)   06/08/24 57.1 kg (125 lb 14.1 oz)   05/22/24 56.7 kg (125 lb)   04/08/24 57.2 kg (126 lb 3.2 oz)   11/03/23 58.1 kg (128 lb)   08/31/23 56.7 kg (125 lb)         Chief Complaint   Patient presents with    Annual Wellness Visit     Bp has been high  Diarrhea if she eats beef or protein bars   Wants referral to [facility] [employer] for her Vertigo          Lacheryl is here for her Medicare annual wellness visit and routine physical exam.     She experiences occasional high blood pressure readings at home, particularly after consuming high-sodium foods, and describes herself as 'salt sensitive'. Her blood pressure is generally good in the  morning but higher in the afternoon and evening. She prefers lifestyle modifications over medication for managing her hypertension. Overall, home readings are 75% within range, with a few higher readings in the 140-150 systolic. Her BP today is 129/83.    She has right knee arthritis and uses a knee strap during impact activities. She remains active, engaging in hiking and skiing, dancing and plans to ski in February.    She is on Reclast  for osteoporosis, which she tolerates well after switching from Fosamax  due to tooth pain. She takes sertraline  for mood stabilization and feels stable, though she experiences irritability if she forgets to take it while traveling.    She experiences diarrhea after consuming beef, particularly steak, but can tolerate hamburgers. She is active in social activities, including pickleball and dance competitions, and travels frequently.       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  2 grandkids, here in [facility].  Other grandkids were in [place] but moved to [place]  in 2021.  Elderly mother is in [place], 2 sisters live close to mother. Mom is 70 They are encouraging her to move to independent living for better eating and better socialization. She has been very depressed since her husband passed 2 years ago. They had been together since her mom was 17.    She took her 36 y.o grandson to [place] in March 2025. It was an amazing trip. She takes each grandchild on a trip for their 16th birthday.   Plans to ski in Feb.- [place].   She hiked 12 miles at [facility] park this summer.     Chronic conditions:     Activity- Continues to exercise regularly- she has retired from soccer. She is now playing pickle ball and doing water  aerobics, yoga twice a week, and dancing 2 nights a week which she has enjoyed.      Right knee OA- mostly manageable. Has done PT. Has also seen Dr. MARYLAND Rasmussen's PA and was told her knee looks pretty good for being 72.      Pre-hypertension- BPs at home mostly in range. A  little higher on the days she ears more salt.      Hyperlipidemia- treated with Atorvastatin .  Cardiac calcium score zero in 08/2019.  08/2023: Recently started taking Co Q 10. Denies myalgia or myopathy.      Osteopenia; had left wrist fracture 2017  Taking Caltrate.  Dexa 2017 - osteopenia.  Dexa 08/2019 - osteopenia.  FRAX hip 2.3%, risk of any fracture 16.5%.  Dexa 09/2021: osteopenia.  FRAX 4.4% risk for hip fracture, 26.3% risk for any fracture - I felt risk was overestimated and did not feel she needed to start medication.  08/2023: Due for Dexa- will do this at [facility] in [place]. Taking calcium chew since oral calcium pills were hard to swallow.   Dexa 09/2023 showed osteopenia in the hips. FRAX score is 8.5% risk of hip fracture and 28.7% of any major osteoporotic fracture in the next 10 years. Exceeding threshold of 3% and 20% for treatment. Started on Fosamax  70 mg weekly. Had tooth pain. Then switched to Reclast . She has seen Dr. Tilden.      Vertigo-  off and on for years, often very severe.  In 2021 she noted that she recently she has noticed some mild very short limited vertigo when she turns over in bed to her left, lasts for about 15  seconds, then resolves.  She does not have any when she turns to her right.     Chronic sinus drainage- ongoing for years. After much reflection she believes symptoms occur mostly in the fall. She has tried various medicines for this but it has not helped, so she does not take any medication regularly.     Vocal Hoarseness- saw [facility] in 2018 for hoarseness.  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 Did vocal exercises for a year - no help, so she stopped them Depression- controlled with Sertraline .       FH breast ca- she is up to date on Mammogram.     H/o abnl Pap- in past but had hysterectomy in 2019.     H/o gout.     Health maintenance:  -Flu shot- today   -Covid booster- today  -Dexa- repeat due June 2026.     Past medical history, medications, medication allergies, social and family history are all reviewed.    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    Eyes:  Conjunctiva are non-icteric and are no injected.  ENT: External ear canals are negative.  TM's are clear bilat, with no erythema.     ENDO:  No thyroid enlargement or nodules.  CV: Heart exam shows regular rhythm, no murmur, no S3, no S4.  Carotid pulses are 2/4 and equal bilat, with no carotid bruits.    RESP: Lungs are clear to auscultation bilat, with no rales, rhonchi, or wheezing.  GI: Normal bowel sounds.  Abdomen is soft, nontender.  No hepatosplenomegaly, no mass.   MS: no joint swelling or erythema.  SKIN:  No rash, no abnormal appearing moles. SKs noted on abdomen  NEURO:  CN's 2-12 intact; strength is equal bilat.  PSYCH:  Good eye contact, normal affect.  LYMPH:  No significant LAD in neck.    ASSESSMENT/PLAN:  Daenerys was seen today for annual wellness visit.    Diagnoses and all orders for this visit:    Medicare annual wellness visit, subsequent    Hyperlipidemia, unspecified hyperlipidemia type  -     CBC AND DIFF; Future  -     COMPREHENSIVE METABOLIC PANEL; Future  -     LIPID PROFILE; Future  -     TSH WITH FREE T4 REFLEX; Future    Mild depression  Osteopenia of multiple sites  -     BONE DENSITY SPINE/HIP; Future    Family history of breast cancer in sister    White coat syndrome with hypertension    Primary osteoarthritis of right knee    Dysphonia    History of vertigo    Osteoarthritis of right knee, unspecified osteoarthritis type    Sinus drainage    Prehypertension    Other orders  -     vit C,E-Zn-coppr-lutein-zeaxan (PRESERVISION AREDS-2) 250-90-40-1 mg cap; Take 1 tablet by mouth daily.  -     AMOXICILLIN  PO; Take 2 Doses by mouth daily. (Patient taking differently: Take 2 Doses by mouth daily. 7 day course from dentist for root canal. Started 11/7)  -     COVID-19 VACCINE (>=12YO) (MODERNA) MRNA PF  -     FLU VACCINE =>65 YO HIGH-DOSE TRIVALENT PF            Adult Wellness Visit  Overall healthy. Normal PE. Discussed RSV vaccination considering travel history and age.  - Fasting labs today.   - Administered flu and COVID vaccines.  - Discussed RSV vaccination; she will decide based on travel plans and age.  - Covid and flu shot today     Pre-hypertension  Blood pressure shows variability with some elevated readings. Salt sensitivity noted. Prefers lifestyle modifications over medication.  - Continue to monitor blood pressure readings.  - Encouraged lower sodium diet.  - Consider medication if blood pressure exceeds 140/90 mmHg.    Osteoporosis on bisphosphonate therapy  On Reclast  with no significant side effects. Previous jaw pain with Fosamax  resolved. No bony issues on imaging.  - Continue Reclast  annually x 3 years (total).   - Ensure adequate vitamin D and calcium intake.  - Encourage weight-bearing activities.  - Schedule follow-up with endocrinologist in one year- June 2026.   - Order DEXA scan for June.    Unilateral primary osteoarthritis, right knee  Right knee arthritis with occasional pain. Recent x-rays show mild arthritis.  - Continue using topat strap for support.  - Engage in physical therapy exercises.  - Plan for skiing in February.    Depression, stable on sertraline   Mood well-managed on sertraline . Occasional forgetfulness of medication while traveling leads to mood changes.  - Continue sertraline  as prescribed.    Seborrheic keratoses  Seborrheic keratoses on trunk, common and benign. No pre-cancerous changes noted.  - No treatment necessary unless bothersome.     FOLLOW-UP:  1 year for wellness visit/comprehensive exam, or sooner if problems.         [1]   Social History  Socioeconomic History    Marital status: Divorced    Number of children: 2   Occupational History    Occupation: Bank VP/construction lending   Tobacco Use    Smoking status: Never    Smokeless tobacco: Never   Substance and Sexual Activity    Alcohol use: Yes     Alcohol/week: 0.0 standard drinks of alcohol     Comment: <1 month    Drug use: Never    Sexual activity: Not Currently     Partners: Male     Birth control/protection: Post-menopausal   Social History Narrative    THIS ENTRY IS PRELIMINARY DOCUMENTATION.               [2]   Outpatient Encounter Medications as of 08/30/2024   Medication Sig Dispense Refill    AMOXICILLIN  PO Take 2 Doses by mouth  daily. (Patient taking differently: Take 2 Doses by mouth daily. 7 day course from dentist for root canal. Started 11/7)      atorvastatin  (LIPITOR) 20 mg tablet Take one tablet by mouth daily. 90 tablet 3    Ca-D3-mag-zinc-cop-mang-boron (CALTRATE 600-D PLUS MINERALS) 600 mg calcium- 800 unit-40 mg chew Chew 1 each by mouth daily.      coenzyme Q10 100 mg tablet Take one tablet by mouth daily. ERGOCALCIFEROL (VITAMIN D2) (VITAMIN D PO) Take 2,000 Units by mouth daily.      GLUCOSAMINE HCL/CHONDR SU A NA (OSTEO BI-FLEX PO) Take 1 mg by mouth daily. (Patient not taking: Reported on 08/30/2024)      sertraline  (ZOLOFT ) 50 mg tablet Take one tablet by mouth daily. 90 tablet 3    vit C,E-Zn-coppr-lutein-zeaxan (PRESERVISION AREDS-2) 250-90-40-1 mg cap Take 1 tablet by mouth daily.      zoledronic  acid/mannitol -water  (RECLAST  IV) Administer 5 mg through vein once.       No facility-administered encounter medications on file as of 08/30/2024.

## 2024-08-30 ENCOUNTER — Encounter: Admit: 2024-08-30 | Discharge: 2024-08-30 | Payer: MEDICARE

## 2024-08-30 ENCOUNTER — Ambulatory Visit: Admit: 2024-08-30 | Discharge: 2024-08-30 | Payer: MEDICARE

## 2024-08-30 VITALS — BP 129/83 | HR 79 | Temp 98.00000°F | Resp 16 | Ht 60.0 in | Wt 127.8 lb

## 2024-08-30 DIAGNOSIS — M1711 Unilateral primary osteoarthritis, right knee: Secondary | ICD-10-CM

## 2024-08-30 DIAGNOSIS — F32A Mild depression: Secondary | ICD-10-CM

## 2024-08-30 DIAGNOSIS — J3489 Other specified disorders of nose and nasal sinuses: Secondary | ICD-10-CM

## 2024-08-30 DIAGNOSIS — Z0000 Encounter for general adult medical examination without abnormal findings: Principal | ICD-10-CM

## 2024-08-30 DIAGNOSIS — Z803 Family history of malignant neoplasm of breast: Secondary | ICD-10-CM

## 2024-08-30 DIAGNOSIS — E785 Hyperlipidemia, unspecified: Secondary | ICD-10-CM

## 2024-08-30 DIAGNOSIS — R030 Elevated blood-pressure reading, without diagnosis of hypertension: Secondary | ICD-10-CM

## 2024-08-30 DIAGNOSIS — Z87898 Personal history of other specified conditions: Secondary | ICD-10-CM

## 2024-08-30 DIAGNOSIS — L821 Other seborrheic keratosis: Secondary | ICD-10-CM

## 2024-08-30 DIAGNOSIS — I10 Essential (primary) hypertension: Secondary | ICD-10-CM

## 2024-08-30 DIAGNOSIS — R490 Dysphonia: Secondary | ICD-10-CM

## 2024-08-30 DIAGNOSIS — M8589 Other specified disorders of bone density and structure, multiple sites: Secondary | ICD-10-CM

## 2024-08-30 MED ORDER — SERTRALINE 50 MG PO TAB
50 mg | ORAL_TABLET | Freq: Every day | ORAL | 3 refills | 30.00000 days | Status: AC
Start: 2024-08-30 — End: ?

## 2024-08-30 MED ORDER — ATORVASTATIN 20 MG PO TAB
20 mg | ORAL_TABLET | Freq: Every day | ORAL | 3 refills | 90.00000 days | Status: AC
Start: 2024-08-30 — End: ?

## 2024-08-30 NOTE — Patient Instructions [37]
 Medicare Primary Care Screening Schedule:    Health Maintenance   Topic Date Due    INFLUENZA VACCINE (1) 05/24/2024    COVID-19 VACCINE (8 - 2025-26 season) 06/24/2024    OSTEOPOROSIS SCREENING/MONITORING  11/02/2024    BREAST CANCER SCREENING  12/10/2024    MEDICARE ANNUAL WELLNESS VISIT  08/30/2025    ADVANCE CARE PLANNING DISCUSSION AND DOCUMENTATION  08/30/2025    DTAP/TDAP VACCINES (2 - Td or Tdap) 04/05/2026    COLORECTAL CANCER SCREENING  05/30/2026    RSV VACCINE (60 years and Older/Pregnant Women) (1 - 1-dose 75+ series) 11/20/2027    SHINGLES RECOMBINANT VACCINE  Completed    PNEUMOCOCCAL VACCINE AGE 17 AND OVER  Completed    HEPATITIS C SCREENING  Completed    DEPRESSION SCREENING  Completed    HPV VACCINES  Aged Out    MENINGOCOCCAL B VACCINE  Aged Out

## 2024-09-02 ENCOUNTER — Encounter: Admit: 2024-09-02 | Discharge: 2024-09-02 | Payer: MEDICARE

## 2024-09-21 ENCOUNTER — Encounter: Admit: 2024-09-21 | Discharge: 2024-09-21 | Payer: MEDICARE

## 2024-11-06 ENCOUNTER — Encounter: Admit: 2024-11-06 | Discharge: 2024-11-06 | Payer: MEDICARE

## 2024-11-06 DIAGNOSIS — M25512 Pain in left shoulder: Secondary | ICD-10-CM

## 2024-11-06 DIAGNOSIS — M810 Age-related osteoporosis without current pathological fracture: Principal | ICD-10-CM

## 2024-11-06 NOTE — Telephone Encounter [36]
 Gabbie from [facility] called to ask for a referral.  Patient called to schedule appt for eval and treat of left shoulder pain.      I spoke with Odella Shines APRN and she approved referral.      Fax number is : [phone]
